# Patient Record
Sex: Female | Born: 1990 | Race: Black or African American | Hispanic: No | Marital: Single | Smoking: Former smoker
Health system: Southern US, Community
[De-identification: ages and names within clinical notes are randomized; demographics above are authoritative.]

## PROBLEM LIST (undated history)

## (undated) HISTORY — PX: APPENDECTOMY: SHX54

---

## 2017-11-18 ENCOUNTER — Encounter (HOSPITAL_BASED_OUTPATIENT_CLINIC_OR_DEPARTMENT_OTHER): Payer: Self-pay

## 2017-11-18 ENCOUNTER — Other Ambulatory Visit: Payer: Self-pay

## 2017-11-18 ENCOUNTER — Emergency Department (HOSPITAL_BASED_OUTPATIENT_CLINIC_OR_DEPARTMENT_OTHER)
Admission: EM | Admit: 2017-11-18 | Discharge: 2017-11-18 | Disposition: A | Discharge status: Home / Self Care | Payer: Self-pay | Attending: Emergency Medicine | Admitting: Emergency Medicine

## 2017-11-18 DIAGNOSIS — R197 Diarrhea, unspecified: Secondary | ICD-10-CM | POA: Insufficient documentation

## 2017-11-18 DIAGNOSIS — R05 Cough: Secondary | ICD-10-CM | POA: Insufficient documentation

## 2017-11-18 DIAGNOSIS — R51 Headache: Secondary | ICD-10-CM | POA: Insufficient documentation

## 2017-11-18 DIAGNOSIS — R0981 Nasal congestion: Secondary | ICD-10-CM | POA: Insufficient documentation

## 2017-11-18 DIAGNOSIS — R69 Illness, unspecified: Secondary | ICD-10-CM | POA: Insufficient documentation

## 2017-11-18 DIAGNOSIS — J029 Acute pharyngitis, unspecified: Secondary | ICD-10-CM | POA: Insufficient documentation

## 2017-11-18 DIAGNOSIS — R509 Fever, unspecified: Secondary | ICD-10-CM | POA: Insufficient documentation

## 2017-11-18 DIAGNOSIS — R112 Nausea with vomiting, unspecified: Secondary | ICD-10-CM | POA: Insufficient documentation

## 2017-11-18 DIAGNOSIS — J111 Influenza due to unidentified influenza virus with other respiratory manifestations: Secondary | ICD-10-CM

## 2017-11-18 DIAGNOSIS — F17210 Nicotine dependence, cigarettes, uncomplicated: Secondary | ICD-10-CM | POA: Insufficient documentation

## 2017-11-18 MED ORDER — SODIUM CHLORIDE 0.9 % IV BOLUS (SEPSIS)
1000.0000 mL | Freq: Once | INTRAVENOUS | Status: AC
  Administered 2017-11-18: 1000 mL via INTRAVENOUS

## 2017-11-18 MED ORDER — ONDANSETRON HCL 4 MG/2ML IJ SOLN
4.0000 mg | Freq: Once | INTRAMUSCULAR | Status: AC
  Administered 2017-11-18: 4 mg via INTRAVENOUS
  Filled 2017-11-18: qty 2

## 2017-11-18 MED ORDER — PROMETHAZINE HCL 25 MG PO TABS: 25.0000 mg | ORAL_TABLET | Freq: Four times a day (QID) | ORAL | 0 refills | Status: DC | PRN

## 2017-11-18 MED ORDER — HYDROCOD POLST-CPM POLST ER 10-8 MG/5ML PO SUER: 5.0000 mL | Freq: Two times a day (BID) | ORAL | 0 refills | Status: DC | PRN

## 2017-11-18 MED ORDER — KETOROLAC TROMETHAMINE 15 MG/ML IJ SOLN
15.0000 mg | Freq: Once | INTRAMUSCULAR | Status: AC
  Administered 2017-11-18: 15 mg via INTRAVENOUS
  Filled 2017-11-18: qty 1

## 2017-11-18 NOTE — ED Triage Notes (Signed)
Pt c/o body aches, n/v, runny nose and fever since Friday.  Pt states she has vomited 3 times today.  Pt took 1000mg  of tylenol at 2100 along with a dose of theraflu

## 2017-11-18 NOTE — ED Notes (Signed)
Pt was asleep, but states her pain is still the same.  Pt given ginger ale for PO challenge.

## 2017-11-18 NOTE — ED Notes (Signed)
Pt verbalizes understanding of d/c instructions and denies any further needs at this time. 

## 2017-11-18 NOTE — ED Provider Notes (Signed)
   [facility] Provider Note: Lowella DellJ. Lane Malick Netz, MD, FACEP  CSN: [id number] MRN: [id number] ARRIVAL: 11/18/17 at 0015 ROOM: [facility]   CHIEF COMPLAINT  Influenza   HISTORY OF PRESENT ILLNESS  11/18/17 12:27 AM Haley Baird is a 26 y.o. female a 4-day history of flulike symptoms.  Specifically she has had nasal congestion, sore throat, cough, fever to 103 degrees, body aches, chills, general malaise and headache behind her eyes.  She is also had watery diarrhea and yesterday had nausea and 3 episodes of vomiting.  She has been coughing and feels like she is having difficulty breathing but has not been wheezing.  She has taken 1 dose of TheraFlu and ibuprofen yesterday with improvement in her fever.  She rates her pain as a 7 out of 10.   History reviewed. No pertinent past medical history.  History reviewed. No pertinent surgical history.  No family history on file.  Social History   Tobacco Use  . Smoking status: Current Every Day Smoker    Packs/day: 0.50    Types: Cigarettes  . Smokeless tobacco: Never Used  Substance Use Topics  . Alcohol use: No    Frequency: Never  . Drug use: Not on file    Prior to Admission medications   Not on File    Allergies Patient has no known allergies.   REVIEW OF SYSTEMS  Negative except as noted here or in the History of Present Illness.   PHYSICAL EXAMINATION  Initial Vital Signs Blood pressure 121/79, pulse 93, temperature 99.9 F (37.7 C), temperature source Oral, resp. rate 18, height 5' (1.524 m), weight 63.5 kg (140 lb), last menstrual period 11/11/2017, SpO2 99 %.  Examination General: Well-developed, well-nourished female in no acute distress; appearance consistent with age of record HENT: normocephalic; atraumatic; mild pharyngeal erythema without exudate Eyes: pupils equal, round and reactive to light; extraocular muscles intact Neck: supple Heart: regular rate and rhythm Lungs: clear to auscultation  bilaterally; dry cough Abdomen: soft; nondistended; nontender; no masses or hepatosplenomegaly; bowel sounds present Extremities: No deformity; full range of motion; pulses normal Neurologic: Awake, alert and oriented; motor function intact in all extremities and symmetric; no facial droop Skin: Warm and dry Psychiatric: Flat affect   RESULTS  Summary of this visit's results, reviewed by myself:   EKG Interpretation  Date/Time:    Ventricular Rate:    PR Interval:    QRS Duration:   QT Interval:    QTC Calculation:   R Axis:     Text Interpretation:        Laboratory Studies: No results found for this or any previous visit (from the past 24 hour(s)). Imaging Studies: No results found.  ED COURSE  Nursing notes and initial vitals signs, including pulse oximetry, reviewed.  Vitals:   11/18/17 0021 11/18/17 0022  BP: 121/79   Pulse: 93   Resp: 18   Temp: 99.9 F (37.7 C)   TempSrc: Oral   SpO2: 99%   Weight:  63.5 kg (140 lb)  Height:  5' (1.524 m)   2:02 AM Drinking fluids without emesis after IV fluid bolus and Zofran.  PROCEDURES    ED DIAGNOSES     ICD-10-CM   1. Influenza-like illness R69        Paula LibraMolpus, Ninnie Fein, MD 11/18/17 [phone]

## 2017-11-18 NOTE — ED Notes (Signed)
ED Provider at bedside. 

## 2017-12-31 ENCOUNTER — Emergency Department (HOSPITAL_BASED_OUTPATIENT_CLINIC_OR_DEPARTMENT_OTHER)
Admission: EM | Admit: 2017-12-31 | Discharge: 2017-12-31 | Disposition: A | Discharge status: Home / Self Care | Payer: Self-pay | Attending: Emergency Medicine | Admitting: Emergency Medicine

## 2017-12-31 ENCOUNTER — Other Ambulatory Visit: Payer: Self-pay

## 2017-12-31 ENCOUNTER — Encounter (HOSPITAL_BASED_OUTPATIENT_CLINIC_OR_DEPARTMENT_OTHER): Payer: Self-pay | Admitting: Emergency Medicine

## 2017-12-31 DIAGNOSIS — Y929 Unspecified place or not applicable: Secondary | ICD-10-CM | POA: Insufficient documentation

## 2017-12-31 DIAGNOSIS — Y999 Unspecified external cause status: Secondary | ICD-10-CM | POA: Insufficient documentation

## 2017-12-31 DIAGNOSIS — Y939 Activity, unspecified: Secondary | ICD-10-CM | POA: Insufficient documentation

## 2017-12-31 DIAGNOSIS — S7012XA Contusion of left thigh, initial encounter: Secondary | ICD-10-CM | POA: Insufficient documentation

## 2017-12-31 DIAGNOSIS — F17210 Nicotine dependence, cigarettes, uncomplicated: Secondary | ICD-10-CM | POA: Insufficient documentation

## 2017-12-31 DIAGNOSIS — L02412 Cutaneous abscess of left axilla: Secondary | ICD-10-CM | POA: Insufficient documentation

## 2017-12-31 DIAGNOSIS — X58XXXA Exposure to other specified factors, initial encounter: Secondary | ICD-10-CM | POA: Insufficient documentation

## 2017-12-31 DIAGNOSIS — D509 Iron deficiency anemia, unspecified: Secondary | ICD-10-CM | POA: Insufficient documentation

## 2017-12-31 LAB — COMPREHENSIVE METABOLIC PANEL
ALBUMIN: 3.7 g/dL (ref 3.5–5.0)
ALT: 11 U/L — ABNORMAL LOW (ref 14–54)
ANION GAP: 7 (ref 5–15)
AST: 14 U/L — ABNORMAL LOW (ref 15–41)
Alkaline Phosphatase: 49 U/L (ref 38–126)
BUN: 9 mg/dL (ref 6–20)
CO2: 24 mmol/L (ref 22–32)
Calcium: 8.5 mg/dL — ABNORMAL LOW (ref 8.9–10.3)
Chloride: 107 mmol/L (ref 101–111)
Creatinine, Ser: 0.88 mg/dL (ref 0.44–1.00)
GFR calc Af Amer: >60 mL/min (ref >60)
GFR calc non Af Amer: >60 mL/min (ref >60)
GLUCOSE: 91 mg/dL (ref 65–99)
POTASSIUM: 3.9 mmol/L (ref 3.5–5.1)
SODIUM: 138 mmol/L (ref 135–145)
Total Bilirubin: 0.5 mg/dL (ref 0.3–1.2)
Total Protein: 6.4 g/dL — ABNORMAL LOW (ref 6.5–8.1)

## 2017-12-31 LAB — CBC
HCT: 32 % — ABNORMAL LOW (ref 36.0–46.0)
Hemoglobin: 10.9 g/dL — ABNORMAL LOW (ref 12.0–15.0)
MCH: 29.3 pg (ref 26.0–34.0)
MCHC: 34.1 g/dL (ref 30.0–36.0)
MCV: 86 fL (ref 78.0–100.0)
Platelets: 246 10*3/uL (ref 150–400)
RBC: 3.72 MIL/uL — ABNORMAL LOW (ref 3.87–5.11)
RDW: 14 % (ref 11.5–15.5)
WBC: 10.1 10*3/uL (ref 4.0–10.5)

## 2017-12-31 MED ORDER — LIDOCAINE-EPINEPHRINE 2 %-1:100000 IJ SOLN
20.0000 mL | Freq: Once | INTRAMUSCULAR | Status: AC
  Administered 2017-12-31: 20 mL via INTRADERMAL
  Filled 2017-12-31: qty 1

## 2017-12-31 NOTE — Discharge Instructions (Signed)
TAKE IRON DAILY AT NIGHT FOR YOUR ANEMIA  Contact a health care provider if: Your cyst or abscess returns. You have a fever. You have more redness, swelling, or pain around your incision. You have more fluid or blood coming from your incision. Your incision feels warm to the touch. You have pus or a bad smell coming from your incision. Get help right away if: You have severe pain or bleeding. You cannot eat or drink without vomiting. You have decreased urine output. You become short of breath. You have chest pain. You cough up blood. The area where the incision and drainage occurred becomes numb or it tingles.

## 2017-12-31 NOTE — ED Provider Notes (Signed)
[facility] EMERGENCY DEPARTMENT Provider Note   CSN: [id number] Arrival date & time: 12/31/17  4098     History   Chief Complaint Chief Complaint  Patient presents with  . Abscess  . Leg Pain    HPI Haley Baird is a 27 y.o. female Who presents for Lef axillary abscess. Onset 4 days ago with worsening pain, now 10/10. No fevers. No previous Hx. Patient also reports painful spots to her left leg.  Appears like bruises. No known injury . Patient is worried about blood clots but has no known hx. She is on OCPs and is a daily smoker.    HPI  History reviewed. No pertinent past medical history.  There are no active problems to display for this patient.   History reviewed. No pertinent surgical history.  OB History    No data available       Home Medications    Prior to Admission medications   Medication Sig Start Date End Date Taking? Authorizing Provider  chlorpheniramine-HYDROcodone (TUSSIONEX PENNKINETIC ER) 10-8 MG/5ML SUER Take 5 mLs by mouth every 12 (twelve) hours as needed. 11/18/17   Molpus, John, MD  promethazine (PHENERGAN) 25 MG tablet Take 1 tablet (25 mg total) by mouth every 6 (six) hours as needed for nausea or vomiting. 11/18/17   Molpus, Jonny Ruiz, MD    Family History History reviewed. No pertinent family history.  Social History Social History   Tobacco Use  . Smoking status: Current Every Day Smoker    Packs/day: 0.50    Types: Cigarettes  . Smokeless tobacco: Never Used  Substance Use Topics  . Alcohol use: No    Frequency: Never  . Drug use: Not on file     Allergies   Patient has no known allergies.   Review of Systems Review of Systems  Ten systems reviewed and are negative for acute change, except as noted in the HPI.   Physical Exam Updated Vital Signs BP 111/72   Pulse 88   Temp 98.6 F (37 C) (Oral)   Resp 18   Ht 5' (1.524 m)   Wt 63.5 kg (140 lb)   LMP 12/31/2017 (Exact Date)   SpO2 98%   BMI 27.34 kg/m    Physical Exam  Constitutional: She is oriented to person, place, and time. She appears well-developed and well-nourished. No distress.  HENT:  Head: Normocephalic and atraumatic.  Eyes: Conjunctivae are normal. No scleral icterus.  Neck: Normal range of motion.  Cardiovascular: Normal rate, regular rhythm and normal heart sounds. Exam reveals no gallop and no friction rub.  No murmur heard. Pulmonary/Chest: Effort normal and breath sounds normal. No respiratory distress.  Abdominal: Soft. Bowel sounds are normal. She exhibits no distension and no mass. There is no tenderness. There is no guarding.  Musculoskeletal:  2 cm fluctuant left axillary abscess with minimal surrounding induration, no signs of surrounding cellulitis  3 cm hematoma of the left lateral thigh  Neurological: She is alert and oriented to person, place, and time.  Skin: Skin is warm and dry. She is not diaphoretic.  Psychiatric: Her behavior is normal.  Nursing note and vitals reviewed.    ED Treatments / Results  Labs (all labs ordered are listed, but only abnormal results are displayed) Labs Reviewed  CBC - Abnormal; Notable for the following components:      Result Value   RBC 3.72 (*)    Hemoglobin 10.9 (*)    HCT 32.0 (*)  All other components within normal limits  COMPREHENSIVE METABOLIC PANEL - Abnormal; Notable for the following components:   Calcium 8.5 (*)    Total Protein 6.4 (*)    AST 14 (*)    ALT 11 (*)    All other components within normal limits    EKG  EKG Interpretation None       Radiology No results found.  Procedures Procedures (including critical care time)  Medications Ordered in ED Medications  lidocaine-EPINEPHrine (XYLOCAINE W/EPI) 2 %-1:100000 (with pres) injection 20 mL (20 mLs Intradermal Given 12/31/17 0945)   INCISION AND DRAINAGE Performed by: Arthor CaptainAbigail Barnaby Rippeon Consent: Verbal consent obtained. Risks and benefits: risks, benefits and alternatives were  discussed Type: abscess  Body area: Left axilla  Anesthesia: local infiltration  Incision was made with a scalpel.  Local anesthetic: lidocaine 2 % with epinephrine  Anesthetic total: 5 ml  Complexity: complex Blunt dissection to break up loculations  Drainage: purulent  Drainage amount: Moderate   Patient tolerance: Patient tolerated the procedure well with no immediate complications.     Initial Impression / Assessment and Plan / ED Course  I have reviewed the triage vital signs and the nursing notes.  Pertinent labs & imaging results that were available during my care of the patient were reviewed by me and considered in my medical decision making (see chart for details).    Patient with skin abscess amenable to incision and drainage.  Abscess was not large enough to warrant packing or drain,  wound recheck in 2 days. Encouraged home warm soaks and flushing.  Mild signs of cellulitis is surrounding skin.  Will d/c to home.  No antibiotic therapy is indicated.    Final Clinical Impressions(s) / ED Diagnoses   Final diagnoses:  Abscess of left axilla  Hematoma of left thigh, initial encounter  Iron deficiency anemia, unspecified iron deficiency anemia type    ED Discharge Orders    None       Arthor CaptainHarris, Aleya Durnell, PA-C 12/31/17 1836    Doug SouJacubowitz, Sam, MD 01/01/18 [phone]

## 2017-12-31 NOTE — ED Triage Notes (Signed)
Patient reports abscess to left axilla x 4 days.  No drainage noted.  Patient also reports left leg pain-states she has "3 spots" on leg that she causing the pain.  Denies injury.

## 2017-12-31 NOTE — ED Notes (Signed)
ED Provider at bedside for I&D of left axiliary area

## 2018-02-03 ENCOUNTER — Emergency Department (HOSPITAL_BASED_OUTPATIENT_CLINIC_OR_DEPARTMENT_OTHER)
Admission: EM | Admit: 2018-02-03 | Discharge: 2018-02-03 | Disposition: A | Discharge status: Home / Self Care | Payer: Self-pay | Attending: Emergency Medicine | Admitting: Emergency Medicine

## 2018-02-03 ENCOUNTER — Encounter (HOSPITAL_BASED_OUTPATIENT_CLINIC_OR_DEPARTMENT_OTHER): Payer: Self-pay | Admitting: *Deleted

## 2018-02-03 ENCOUNTER — Encounter (HOSPITAL_BASED_OUTPATIENT_CLINIC_OR_DEPARTMENT_OTHER): Payer: Self-pay | Admitting: Emergency Medicine

## 2018-02-03 ENCOUNTER — Other Ambulatory Visit: Payer: Self-pay

## 2018-02-03 DIAGNOSIS — Y939 Activity, unspecified: Secondary | ICD-10-CM | POA: Insufficient documentation

## 2018-02-03 DIAGNOSIS — F17210 Nicotine dependence, cigarettes, uncomplicated: Secondary | ICD-10-CM | POA: Insufficient documentation

## 2018-02-03 DIAGNOSIS — S50861A Insect bite (nonvenomous) of right forearm, initial encounter: Secondary | ICD-10-CM | POA: Insufficient documentation

## 2018-02-03 DIAGNOSIS — Z5189 Encounter for other specified aftercare: Secondary | ICD-10-CM

## 2018-02-03 DIAGNOSIS — Y929 Unspecified place or not applicable: Secondary | ICD-10-CM | POA: Insufficient documentation

## 2018-02-03 DIAGNOSIS — Y999 Unspecified external cause status: Secondary | ICD-10-CM | POA: Insufficient documentation

## 2018-02-03 DIAGNOSIS — W57XXXA Bitten or stung by nonvenomous insect and other nonvenomous arthropods, initial encounter: Secondary | ICD-10-CM | POA: Insufficient documentation

## 2018-02-03 DIAGNOSIS — Z4800 Encounter for change or removal of nonsurgical wound dressing: Secondary | ICD-10-CM | POA: Insufficient documentation

## 2018-02-03 MED ORDER — CEPHALEXIN 500 MG PO CAPS: 500.0000 mg | ORAL_CAPSULE | Freq: Four times a day (QID) | ORAL | 0 refills | Status: DC

## 2018-02-03 NOTE — ED Triage Notes (Signed)
She has a possible insect bite to her right forearm. She was here a few hours ago for same. She was given a Rx but never got the medication filled.

## 2018-02-03 NOTE — Discharge Instructions (Signed)
Apply warm compresses to area and take your antibiotics as directed by her provider yesterday.

## 2018-02-03 NOTE — ED Triage Notes (Signed)
Pt states she was bit by a spider yesterday at work on her [employer]. Today area is warm, raised, red, with intact skin that appears to have pus underneath. Denies fevers or other issue. Employer is aware she is here. She states they request a drug screen but states "I'm not doing it. I don't know why they need a drug screen for a spider bite".

## 2018-02-03 NOTE — ED Provider Notes (Signed)
[facility] EMERGENCY DEPARTMENT Provider Note   CSN: [id number] Arrival date & time: 02/03/18  0212     History   Chief Complaint Chief Complaint  Patient presents with  . Insect Bite    HPI Haley Baird is a 27 y.o. female.  Patient is a 27 year old female presenting with complaints of a spider bite.  She reports a spider bit her on the right forearm.  She has swelling, redness, and a small pustule in this area.  She denies any fevers or chills.   The history is provided by the patient.    History reviewed. No pertinent past medical history.  There are no active problems to display for this patient.   Past Surgical History:  Procedure Laterality Date  . APPENDECTOMY      OB History    No data available       Home Medications    Prior to Admission medications   Medication Sig Start Date End Date Taking? Authorizing Provider  chlorpheniramine-HYDROcodone (TUSSIONEX PENNKINETIC ER) 10-8 MG/5ML SUER Take 5 mLs by mouth every 12 (twelve) hours as needed. 11/18/17   Molpus, John, MD  promethazine (PHENERGAN) 25 MG tablet Take 1 tablet (25 mg total) by mouth every 6 (six) hours as needed for nausea or vomiting. 11/18/17   Molpus, John, MD    Family History No family history on file.  Social History Social History   Tobacco Use  . Smoking status: Current Every Day Smoker    Packs/day: 0.50    Types: Cigarettes  . Smokeless tobacco: Never Used  Substance Use Topics  . Alcohol use: No    Frequency: Never  . Drug use: Not on file     Allergies   Patient has no known allergies.   Review of Systems Review of Systems  All other systems reviewed and are negative.    Physical Exam Updated Vital Signs BP 133/65 (BP Location: Right Arm)   Pulse 77   Temp 98.5 F (36.9 C) (Oral)   Resp 20   Ht 5' (1.524 m)   Wt 63.5 kg (140 lb)   SpO2 100%   BMI 27.34 kg/m   Physical Exam  Constitutional: She appears well-developed and  well-nourished. No distress.  HENT:  Head: Normocephalic and atraumatic.  Neck: Normal range of motion. Neck supple.  Skin: Skin is warm and dry. She is not diaphoretic.  The right forearm has a small pustule to the volar aspect just distal to the elbow joint.  There is some surrounding erythema, however no significant fluctuance.  Nursing note and vitals reviewed.    ED Treatments / Results  Labs (all labs ordered are listed, but only abnormal results are displayed) Labs Reviewed - No data to display  EKG  EKG Interpretation None       Radiology No results found.  Procedures Procedures (including critical care time)  Medications Ordered in ED Medications - No data to display   Initial Impression / Assessment and Plan / ED Course  I have reviewed the triage vital signs and the nursing notes.  Pertinent labs & imaging results that were available during my care of the patient were reviewed by me and considered in my medical decision making (see chart for details).  I see no indication for I&D.  This will be treated with antibiotics and warm soaks.  To return as needed for any problems.  Final Clinical Impressions(s) / ED Diagnoses   Final diagnoses:  None  ED Discharge Orders    None       Geoffery Lyonselo, Berenize Gatlin, MD 02/03/18 [phone]

## 2018-02-03 NOTE — Discharge Instructions (Signed)
Keflex as prescribed.  Apply warm compresses as frequently as possible for the next several days.  Return to the emergency department for increased swelling, red streaks up and down the arm, worsening pain, or other new and concerning symptoms.

## 2018-02-03 NOTE — ED Provider Notes (Signed)
[facility] EMERGENCY DEPARTMENT Provider Note   CSN: [id number] Arrival date & time: 02/03/18  2158     History   Chief Complaint Chief Complaint  Patient presents with  . Wound Check    HPI Haley Baird is a 27 y.o. female who presents to ED for evaluation of a wound recheck.  She was seen and evaluated here approximately 20 hours ago for a bug bite to her right forearm.  She is here because she needs a note saying that she can return to work soon as possible.  She did not get her antibiotics filled but has been applying warm compresses with relief to the area.  Denies any fevers or additional symptoms at this time.  HPI  History reviewed. No pertinent past medical history.  There are no active problems to display for this patient.   Past Surgical History:  Procedure Laterality Date  . APPENDECTOMY      OB History    No data available       Home Medications    Prior to Admission medications   Medication Sig Start Date End Date Taking? Authorizing Provider  cephALEXin (KEFLEX) 500 MG capsule Take 1 capsule (500 mg total) by mouth 4 (four) times daily. 02/03/18   Geoffery Lyonselo, Douglas, MD  chlorpheniramine-HYDROcodone (TUSSIONEX PENNKINETIC ER) 10-8 MG/5ML SUER Take 5 mLs by mouth every 12 (twelve) hours as needed. 11/18/17   Molpus, John, MD  promethazine (PHENERGAN) 25 MG tablet Take 1 tablet (25 mg total) by mouth every 6 (six) hours as needed for nausea or vomiting. 11/18/17   Molpus, John, MD    Family History No family history on file.  Social History Social History   Tobacco Use  . Smoking status: Current Every Day Smoker    Packs/day: 0.50    Types: Cigarettes  . Smokeless tobacco: Never Used  Substance Use Topics  . Alcohol use: No    Frequency: Never  . Drug use: Not on file     Allergies   Patient has no known allergies.   Review of Systems Review of Systems  Constitutional: Negative for chills and fever.  Musculoskeletal: Negative  for arthralgias.  Skin: Positive for wound.     Physical Exam Updated Vital Signs BP 131/72   Pulse 83   Temp 99 F (37.2 C)   Resp 18   Ht 5' (1.524 m)   Wt 63.5 kg (140 lb)   LMP 01/21/2018   SpO2 99%   BMI 27.34 kg/m   Physical Exam  Constitutional: She appears well-developed and well-nourished. No distress.  Nontoxic appearing in no acute distress.  HENT:  Head: Normocephalic and atraumatic.  Eyes: Conjunctivae and EOM are normal. No scleral icterus.  Neck: Normal range of motion.  Pulmonary/Chest: Effort normal. No respiratory distress.  Neurological: She is alert.  Skin: No rash noted. She is not diaphoretic. There is erythema.  Small pustule noted on the right forearm just distal to the elbow.  Slight surrounding erythema but no fluctuance.  Psychiatric: She has a normal mood and affect.  Nursing note and vitals reviewed.    ED Treatments / Results  Labs (all labs ordered are listed, but only abnormal results are displayed) Labs Reviewed - No data to display  EKG  EKG Interpretation None       Radiology No results found.  Procedures Procedures (including critical care time)  Medications Ordered in ED Medications - No data to display   Initial Impression / Assessment and  Plan / ED Course  I have reviewed the triage vital signs and the nursing notes.  Pertinent labs & imaging results that were available during my care of the patient were reviewed by me and considered in my medical decision making (see chart for details).     Presents to ED for evaluation of wound recheck.  She was seen and evaluated here 20 hours ago for insect bite to her right forearm.  She did not get her antibiotics filled.  She is basically here because she needs a note saying that she can return to work tomorrow.  I did offer her 1 dose of her antibiotics here but she declined stating that "I promise I will get it filled tonight."  She is continuing to use warm compresses  with improvement in her symptoms.  The wound appears similar to the description from the note 20 hours ago.  Patient appears stable for discharge at this time.  Strict return precautions given.  Portions of this note were generated with Scientist, clinical (histocompatibility and immunogenetics). Dictation errors may occur despite best attempts at proofreading.   Final Clinical Impressions(s) / ED Diagnoses   Final diagnoses:  Visit for wound check    ED Discharge Orders    None       Dietrich Pates, PA-C 02/03/18 2334    Palumbo, April, MD 02/04/18 1610

## 2018-03-12 ENCOUNTER — Emergency Department (HOSPITAL_BASED_OUTPATIENT_CLINIC_OR_DEPARTMENT_OTHER): Payer: Self-pay

## 2018-03-12 ENCOUNTER — Emergency Department (HOSPITAL_BASED_OUTPATIENT_CLINIC_OR_DEPARTMENT_OTHER)
Admission: EM | Admit: 2018-03-12 | Discharge: 2018-03-12 | Disposition: A | Discharge status: Home / Self Care | Payer: Self-pay | Attending: Emergency Medicine | Admitting: Emergency Medicine

## 2018-03-12 ENCOUNTER — Other Ambulatory Visit: Payer: Self-pay

## 2018-03-12 ENCOUNTER — Encounter (HOSPITAL_BASED_OUTPATIENT_CLINIC_OR_DEPARTMENT_OTHER): Payer: Self-pay | Admitting: *Deleted

## 2018-03-12 DIAGNOSIS — Z79899 Other long term (current) drug therapy: Secondary | ICD-10-CM | POA: Insufficient documentation

## 2018-03-12 DIAGNOSIS — F17210 Nicotine dependence, cigarettes, uncomplicated: Secondary | ICD-10-CM | POA: Insufficient documentation

## 2018-03-12 DIAGNOSIS — R0789 Other chest pain: Secondary | ICD-10-CM | POA: Insufficient documentation

## 2018-03-12 LAB — CBC WITH DIFFERENTIAL/PLATELET
BASOS ABS: 0 10*3/uL (ref 0.0–0.1)
BASOS PCT: 0 %
Eosinophils Absolute: 0.1 10*3/uL (ref 0.0–0.7)
Eosinophils Relative: 1 %
HEMATOCRIT: 34.2 % — AB (ref 36.0–46.0)
HEMOGLOBIN: 11.6 g/dL — AB (ref 12.0–15.0)
Lymphocytes Relative: 31 %
Lymphs Abs: 3.1 10*3/uL (ref 0.7–4.0)
MCH: 29.1 pg (ref 26.0–34.0)
MCHC: 33.9 g/dL (ref 30.0–36.0)
MCV: 85.7 fL (ref 78.0–100.0)
Monocytes Absolute: 1.1 10*3/uL — ABNORMAL HIGH (ref 0.1–1.0)
Monocytes Relative: 11 %
NEUTROS ABS: 5.7 10*3/uL (ref 1.7–7.7)
NEUTROS PCT: 57 %
Platelets: 237 10*3/uL (ref 150–400)
RBC: 3.99 MIL/uL (ref 3.87–5.11)
RDW: 13.3 % (ref 11.5–15.5)
WBC: 10 10*3/uL (ref 4.0–10.5)

## 2018-03-12 LAB — TROPONIN I

## 2018-03-12 LAB — COMPREHENSIVE METABOLIC PANEL
ALBUMIN: 3.8 g/dL (ref 3.5–5.0)
ALK PHOS: 50 U/L (ref 38–126)
ALT: 13 U/L — AB (ref 14–54)
AST: 17 U/L (ref 15–41)
Anion gap: 8 (ref 5–15)
BILIRUBIN TOTAL: 0.3 mg/dL (ref 0.3–1.2)
BUN: 9 mg/dL (ref 6–20)
CO2: 23 mmol/L (ref 22–32)
Calcium: 8.8 mg/dL — ABNORMAL LOW (ref 8.9–10.3)
Chloride: 105 mmol/L (ref 101–111)
Creatinine, Ser: 0.85 mg/dL (ref 0.44–1.00)
GFR calc Af Amer: >60 mL/min (ref >60)
GFR calc non Af Amer: >60 mL/min (ref >60)
GLUCOSE: 97 mg/dL (ref 65–99)
POTASSIUM: 3.9 mmol/L (ref 3.5–5.1)
Sodium: 136 mmol/L (ref 135–145)
TOTAL PROTEIN: 7.1 g/dL (ref 6.5–8.1)

## 2018-03-12 LAB — LIPASE, BLOOD: Lipase: 32 U/L (ref 11–51)

## 2018-03-12 LAB — D-DIMER, QUANTITATIVE: D-Dimer, Quant: <0.27 ug/mL-FEU (ref 0.00–0.50)

## 2018-03-12 LAB — PREGNANCY, URINE: Preg Test, Ur: NEGATIVE

## 2018-03-12 MED ORDER — METHOCARBAMOL 750 MG PO TABS: 750.0000 mg | ORAL_TABLET | Freq: Three times a day (TID) | ORAL | 0 refills | Status: DC | PRN

## 2018-03-12 MED ORDER — GI COCKTAIL ~~LOC~~
30.0000 mL | Freq: Once | ORAL | Status: AC
  Administered 2018-03-12: 30 mL via ORAL
  Filled 2018-03-12: qty 30

## 2018-03-12 NOTE — Discharge Instructions (Addendum)
You are being prescribed a medication which may make you sleepy. For 24 hours after one dose please do not drive, operate heavy machinery, care for a small child with out another adult present, or perform any activities that may cause harm to you or someone else if you were to fall asleep or be impaired.   I have given you information on heart burn.  Please start taking Maalox liquid as directed on the bottle for heart burn.  In addition please start taking Prilosec for the next two weeks.  Please follow all directions on the containers.  Please obtain and follow up with your primary care doctor.  If your chest pain worsens, radiates up to her neck or down the left arm or you have any concerns please seek additional medical evaluation.

## 2018-03-12 NOTE — ED Triage Notes (Signed)
Sharp epigastric pain x 1 week. Cough x 2 days. Pain gets worse when she eats or raises her right arm.

## 2018-03-12 NOTE — ED Notes (Signed)
ED Provider at bedside. 

## 2018-03-12 NOTE — ED Provider Notes (Signed)
[facility] EMERGENCY DEPARTMENT Provider Note   CSN: [id number] Arrival date & time: 03/12/18  1516     History   Chief Complaint Chief Complaint  Patient presents with  . Chest Pain    HPI Haley Baird is a 27 y.o. female who presents today for evaluation of chest pain.  She reports that her chest pain has been going on for about 1 week and reports that it feels like shooting through from her front to her back.  She denies any fevers at home, she reports that she is getting over nasal congestion, however denies coughs.  She  Reports feeling generally short of breath.  She reports ehr pain is worse when she eats.  She does not have any gall bladder history.  She denies any recent trauma.   HPI  History reviewed. No pertinent past medical history.  There are no active problems to display for this patient.   Past Surgical History:  Procedure Laterality Date  . APPENDECTOMY      OB History   None      Home Medications    Prior to Admission medications   Medication Sig Start Date End Date Taking? Authorizing Provider  cephALEXin (KEFLEX) 500 MG capsule Take 1 capsule (500 mg total) by mouth 4 (four) times daily. 02/03/18   Geoffery Lyonselo, Douglas, MD  chlorpheniramine-HYDROcodone (TUSSIONEX PENNKINETIC ER) 10-8 MG/5ML SUER Take 5 mLs by mouth every 12 (twelve) hours as needed. 11/18/17   Molpus, John, MD  methocarbamol (ROBAXIN) 750 MG tablet Take 1-2 tablets (750-1,500 mg total) by mouth 3 (three) times daily as needed for muscle spasms. 03/12/18   Cristina GongHammond, Shaan Rhoads W, PA-C  promethazine (PHENERGAN) 25 MG tablet Take 1 tablet (25 mg total) by mouth every 6 (six) hours as needed for nausea or vomiting. 11/18/17   Molpus, John, MD    Family History No family history on file.  Social History Social History   Tobacco Use  . Smoking status: Current Every Day Smoker    Packs/day: 0.50    Types: Cigarettes  . Smokeless tobacco: Never Used  Substance Use Topics  .  Alcohol use: No    Frequency: Never  . Drug use: Not on file     Allergies   Patient has no known allergies.   Review of Systems Review of Systems  Constitutional: Negative for activity change and fever.  Respiratory: Positive for chest tightness and shortness of breath.   Cardiovascular: Positive for chest pain. Negative for palpitations and leg swelling.  Gastrointestinal: Positive for nausea. Negative for abdominal pain ("my belly is fine."), constipation, diarrhea and vomiting.  Genitourinary: Negative for dysuria and hematuria.  Musculoskeletal: Positive for back pain. Negative for neck pain.  Skin: Negative for rash.  Neurological: Negative for headaches.  All other systems reviewed and are negative.    Physical Exam Updated Vital Signs BP 108/63 (BP Location: Left Arm)   Pulse 82   Temp 98 F (36.7 C) (Oral)   Resp 20   Ht 5' (1.524 m)   Wt 65.8 kg (145 lb)   LMP 03/05/2018   SpO2 100%   BMI 28.32 kg/m   Physical Exam  Constitutional: She appears well-developed and well-nourished.  Non-toxic appearance. No distress.  HENT:  Head: Normocephalic and atraumatic.  Eyes: Conjunctivae are normal.  Neck: Normal range of motion. Neck supple.  Cardiovascular: Normal rate and regular rhythm.  No murmur heard. Pulses:      Radial pulses are 2+ on the right  side, and 2+ on the left side.  Pulmonary/Chest: Effort normal and breath sounds normal. No respiratory distress. She has no decreased breath sounds. She has no wheezes. She has no rhonchi. She has no rales.  Patient is able to speak for multiple sentences before taking a breath.    Abdominal: Soft. Bowel sounds are normal. She exhibits no distension. There is no tenderness.  Musculoskeletal: She exhibits no edema.       Right lower leg: Normal. She exhibits no tenderness and no edema.       Left lower leg: Normal. She exhibits no tenderness and no edema.  Palpation medially to left scapula recreates and worsens  her pain.    Neurological: She is alert.  Skin: Skin is warm and dry.  Psychiatric: She has a normal mood and affect. Her behavior is normal.  Nursing note and vitals reviewed.    ED Treatments / Results  Labs (all labs ordered are listed, but only abnormal results are displayed) Labs Reviewed  CBC WITH DIFFERENTIAL/PLATELET - Abnormal; Notable for the following components:      Result Value   Hemoglobin 11.6 (*)    HCT 34.2 (*)    Monocytes Absolute 1.1 (*)    All other components within normal limits  COMPREHENSIVE METABOLIC PANEL - Abnormal; Notable for the following components:   Calcium 8.8 (*)    ALT 13 (*)    All other components within normal limits  LIPASE, BLOOD  PREGNANCY, URINE  D-DIMER, QUANTITATIVE (NOT AT [facility])  TROPONIN I    EKG  EKG Interpretation  Date/Time:  Thursday March 12 2018 15:21:25 EDT Ventricular Rate:  72 PR Interval:  172 QRS Duration: 98 QT Interval:  362 QTC Calculation: 396 R Axis:   90 Text Interpretation:  Normal sinus rhythm Rightward axis Borderline ECG No prior ECG for comparison NO STEMI Confirmed by Theda Belfast ([id number]) on 03/12/2018 3:26:51 PM       Radiology Dg Chest 2 View  Result Date: 03/12/2018 CLINICAL DATA:  Mid to left-sided chest pain due radiating to the left scapula with dyspnea x3 days. EXAM: CHEST - 2 VIEW COMPARISON:  None. FINDINGS: The heart size and mediastinal contours are within normal limits. Both lungs are clear. The visualized skeletal structures are unremarkable. IMPRESSION: No active cardiopulmonary disease. Electronically Signed   By: Tollie Eth M.D.   On: 03/12/2018 18:21    Procedures Procedures (including critical care time)  Medications Ordered in ED Medications  gi cocktail (Maalox,Lidocaine,Donnatal) (30 mLs Oral Given 03/12/18 1843)     Initial Impression / Assessment and Plan / ED Course  I have reviewed the triage vital signs and the nursing notes.  Pertinent labs & imaging results  that were available during my care of the patient were reviewed by me and considered in my medical decision making (see chart for details).  Clinical Course as of Mar 12 2141  Thu Mar 12, 2018  1840 Patient reevaluated, informed of her results.  She is refusing the p.o. challenge and just wishes for discharge.  Return precautions were discussed and she states understanding.   [EH]    Clinical Course User Index [EH] Cristina Gong, PA-C    Patient is to be discharged with recommendation to follow up with PCP in regards to today's hospital visit. Chest pain is not likely of cardiac or pulmonary etiology d/t presentation, d-dimer negative, VSS, no tracheal deviation, no JVD or new murmur, RRR, breath sounds equal bilaterally, EKG without acute  abnormalities, negative troponin, and negative CXR. Pt has been advised to return to the ED if CP becomes exertional, associated with diaphoresis or nausea, radiates to left jaw/arm, worsens or becomes concerning in any way. Pt appears reliable for follow up and is agreeable to discharge.   Suspect CP is caused by a combination of muscle spasms and heart burn.  Treatments for both of these conditions discussed.     Final Clinical Impressions(s) / ED Diagnoses   Final diagnoses:  Atypical chest pain    ED Discharge Orders        Ordered    methocarbamol (ROBAXIN) 750 MG tablet  3 times daily PRN     03/12/18 1844       Cristina Gong, PA-C 03/12/18 2142    Tegeler, Canary Brim, MD 03/13/18 [id number]

## 2018-03-12 NOTE — ED Notes (Signed)
Patient transported to X-ray 

## 2018-03-12 NOTE — ED Notes (Signed)
Pt unhappy with her wait time. Pt given apology and explained triage process.

## 2018-03-12 NOTE — ED Notes (Signed)
Pt on monitor 

## 2018-06-21 ENCOUNTER — Other Ambulatory Visit: Payer: Self-pay

## 2018-06-21 ENCOUNTER — Encounter (HOSPITAL_BASED_OUTPATIENT_CLINIC_OR_DEPARTMENT_OTHER): Payer: Self-pay | Admitting: *Deleted

## 2018-06-21 ENCOUNTER — Emergency Department (HOSPITAL_BASED_OUTPATIENT_CLINIC_OR_DEPARTMENT_OTHER)
Admission: EM | Admit: 2018-06-21 | Discharge: 2018-06-21 | Disposition: A | Discharge status: Home / Self Care | Payer: Self-pay | Attending: Emergency Medicine | Admitting: Emergency Medicine

## 2018-06-21 DIAGNOSIS — Z79899 Other long term (current) drug therapy: Secondary | ICD-10-CM | POA: Insufficient documentation

## 2018-06-21 DIAGNOSIS — F17210 Nicotine dependence, cigarettes, uncomplicated: Secondary | ICD-10-CM | POA: Insufficient documentation

## 2018-06-21 DIAGNOSIS — R51 Headache: Secondary | ICD-10-CM | POA: Insufficient documentation

## 2018-06-21 DIAGNOSIS — R519 Headache, unspecified: Secondary | ICD-10-CM

## 2018-06-21 MED ORDER — ACETAMINOPHEN 325 MG PO TABS
650.0000 mg | ORAL_TABLET | Freq: Once | ORAL | Status: AC
  Administered 2018-06-21: 650 mg via ORAL
  Filled 2018-06-21: qty 2

## 2018-06-21 MED ORDER — NAPROXEN 250 MG PO TABS
500.0000 mg | ORAL_TABLET | Freq: Once | ORAL | Status: AC
  Administered 2018-06-21: 500 mg via ORAL
  Filled 2018-06-21: qty 2

## 2018-06-21 MED ORDER — IBUPROFEN 600 MG PO TABS: 600.0000 mg | ORAL_TABLET | Freq: Four times a day (QID) | ORAL | 0 refills | Status: DC | PRN

## 2018-06-21 MED ORDER — ACETAMINOPHEN ER 650 MG PO TBCR: 650.0000 mg | EXTENDED_RELEASE_TABLET | Freq: Three times a day (TID) | ORAL | 0 refills | Status: DC

## 2018-06-21 NOTE — ED Provider Notes (Signed)
[facility] EMERGENCY DEPARTMENT Provider Note   CSN: [id number] Arrival date & time: 06/21/18  0234     History   Chief Complaint Chief Complaint  Patient presents with  . Headache    HPI Haley Baird is a 27 y.o. female.  HPI 27 year old female comes in with chief complaint of headaches. Patient states that she has had moderately severe headaches that present intermittently for the last several months.  The current episode has been going on for 2 days and is also intermittent, but lasting longer than usual.  Patient states that the headaches are at different spots and described as throbbing and sharp pain.  Patient has light and sound sensitivity and she also feels like there is pain behind both of her eyes.  Patient denies any vision change and she has no nausea, numbness, tingling, dizziness.  Family history of migraines is present.  There is no pattern to the headache, particularly it is not related to her periods or time of the day or positions.  History reviewed. No pertinent past medical history.  There are no active problems to display for this patient.   Past Surgical History:  Procedure Laterality Date  . APPENDECTOMY       OB History   None      Home Medications    Prior to Admission medications   Medication Sig Start Date End Date Taking? Authorizing Provider  acetaminophen (TYLENOL 8 HOUR) 650 MG CR tablet Take 1 tablet (650 mg total) by mouth every 8 (eight) hours. 06/21/18   Derwood Kaplan, MD  cephALEXin (KEFLEX) 500 MG capsule Take 1 capsule (500 mg total) by mouth 4 (four) times daily. 02/03/18   Geoffery Lyons, MD  chlorpheniramine-HYDROcodone (TUSSIONEX PENNKINETIC ER) 10-8 MG/5ML SUER Take 5 mLs by mouth every 12 (twelve) hours as needed. 11/18/17   Molpus, John, MD  ibuprofen (ADVIL,MOTRIN) 600 MG tablet Take 1 tablet (600 mg total) by mouth every 6 (six) hours as needed. 06/21/18   Derwood Kaplan, MD  methocarbamol (ROBAXIN) 750 MG tablet  Take 1-2 tablets (750-1,500 mg total) by mouth 3 (three) times daily as needed for muscle spasms. 03/12/18   Cristina Gong, PA-C  promethazine (PHENERGAN) 25 MG tablet Take 1 tablet (25 mg total) by mouth every 6 (six) hours as needed for nausea or vomiting. 11/18/17   Molpus, Jonny Ruiz, MD    Family History History reviewed. No pertinent family history.  Social History Social History   Tobacco Use  . Smoking status: Current Every Day Smoker    Packs/day: 0.50    Types: Cigarettes  . Smokeless tobacco: Never Used  Substance Use Topics  . Alcohol use: No    Frequency: Never  . Drug use: Not on file     Allergies   Patient has no known allergies.   Review of Systems Review of Systems  Constitutional: Positive for activity change.  Eyes: Negative for visual disturbance.  Gastrointestinal: Negative for nausea and vomiting.  Allergic/Immunologic: Negative for immunocompromised state.  Neurological: Positive for headaches. Negative for dizziness, seizures, syncope, speech difficulty, weakness, light-headedness and numbness.  Hematological: Does not bruise/bleed easily.     Physical Exam Updated Vital Signs BP (!) 107/57 (BP Location: Left Arm)   Pulse (!) 52   Temp 98.2 F (36.8 C) (Oral)   Resp 18   Ht 5\' 3"  (1.6 m)   Wt 67.1 kg (148 lb)   LMP 06/20/2018 (Exact Date)   SpO2 100%   BMI 26.22 kg/m  Physical Exam  Constitutional: She is oriented to person, place, and time. She appears well-developed.  HENT:  Head: Normocephalic and atraumatic.  Eyes: Pupils are equal, round, and reactive to light. EOM are normal.  Neck: Normal range of motion. Neck supple.  Cardiovascular: Normal rate.  Pulmonary/Chest: Effort normal.  Abdominal: Bowel sounds are normal.  Neurological: She is alert and oriented to person, place, and time. She is not disoriented. No cranial nerve deficit or sensory deficit.  Skin: Skin is warm and dry.  Nursing note and vitals  reviewed.    ED Treatments / Results  Labs (all labs ordered are listed, but only abnormal results are displayed) Labs Reviewed - No data to display  EKG None  Radiology No results found.  Procedures Procedures (including critical care time)  Medications Ordered in ED Medications  naproxen (NAPROSYN) tablet 500 mg (has no administration in time range)  acetaminophen (TYLENOL) tablet 650 mg (has no administration in time range)     Initial Impression / Assessment and Plan / ED Course  I have reviewed the triage vital signs and the nursing notes.  Pertinent labs & imaging results that were available during my care of the patient were reviewed by me and considered in my medical decision making (see chart for details).     27 year old female comes in with chief complaint of intermittent headaches.  Headaches have been present for the last several months.  There is no specific trigger or evoking factors.  Based on history, without any space-occupying lesion to the brain or brain aneurysms.  There is no focal neurologic complaints.  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  We will treat her with Tylenol and ibuprofen, we have given  Her [facility] follow-up.  Final Clinical Impressions(s) / ED Diagnoses   Final diagnoses:  Bad headache  Intermittent headache    ED Discharge Orders        Ordered    ibuprofen (ADVIL,MOTRIN) 600 MG tablet  Every 6 hours PRN     06/21/18 0526    acetaminophen (TYLENOL 8 HOUR) 650 MG CR tablet  Every 8 hours     06/21/18 0526       Derwood KaplanNanavati, Brianne Maina, MD 06/21/18 0530

## 2018-06-21 NOTE — ED Notes (Signed)
Pt given d/c instructions as per chart. Rx x 2. Verbalizes understanding. No questions. 

## 2018-06-21 NOTE — ED Notes (Signed)
ED Provider at bedside. 

## 2018-06-21 NOTE — Discharge Instructions (Signed)
We saw you in the ER for headaches.  We are not sure what is causing your symptoms. The workup in the ER is not complete, and is limited to screening for life threatening and emergent conditions only, so please see a primary care doctor for further evaluation.  Please return to the ER if the headache gets severe and in not improving, you have associated new one sided numbness, tingling, weakness or confusion, seizures, poor balance or poor vision.

## 2018-06-21 NOTE — ED Triage Notes (Signed)
Headache x 2 days. Pt states she gets frequent H/As, but has not been diagnosed with migraines. Sensitive to light and noise. PERRL.

## 2018-06-21 NOTE — ED Notes (Signed)
Pt triaged by this RN. Took Tylenol at 9p and Excedrin at 0100. Resting quietly. Eyes closed. Easily aroused.

## 2019-07-08 IMAGING — DX DG CHEST 2V
2 series · 2 of 2 positions shown · non-contrast
Comparison: None.

CLINICAL DATA: Mid to left-sided chest pain due radiating to the
left scapula with dyspnea x3 days.

EXAM:
CHEST - 2 VIEW

[chest pa]
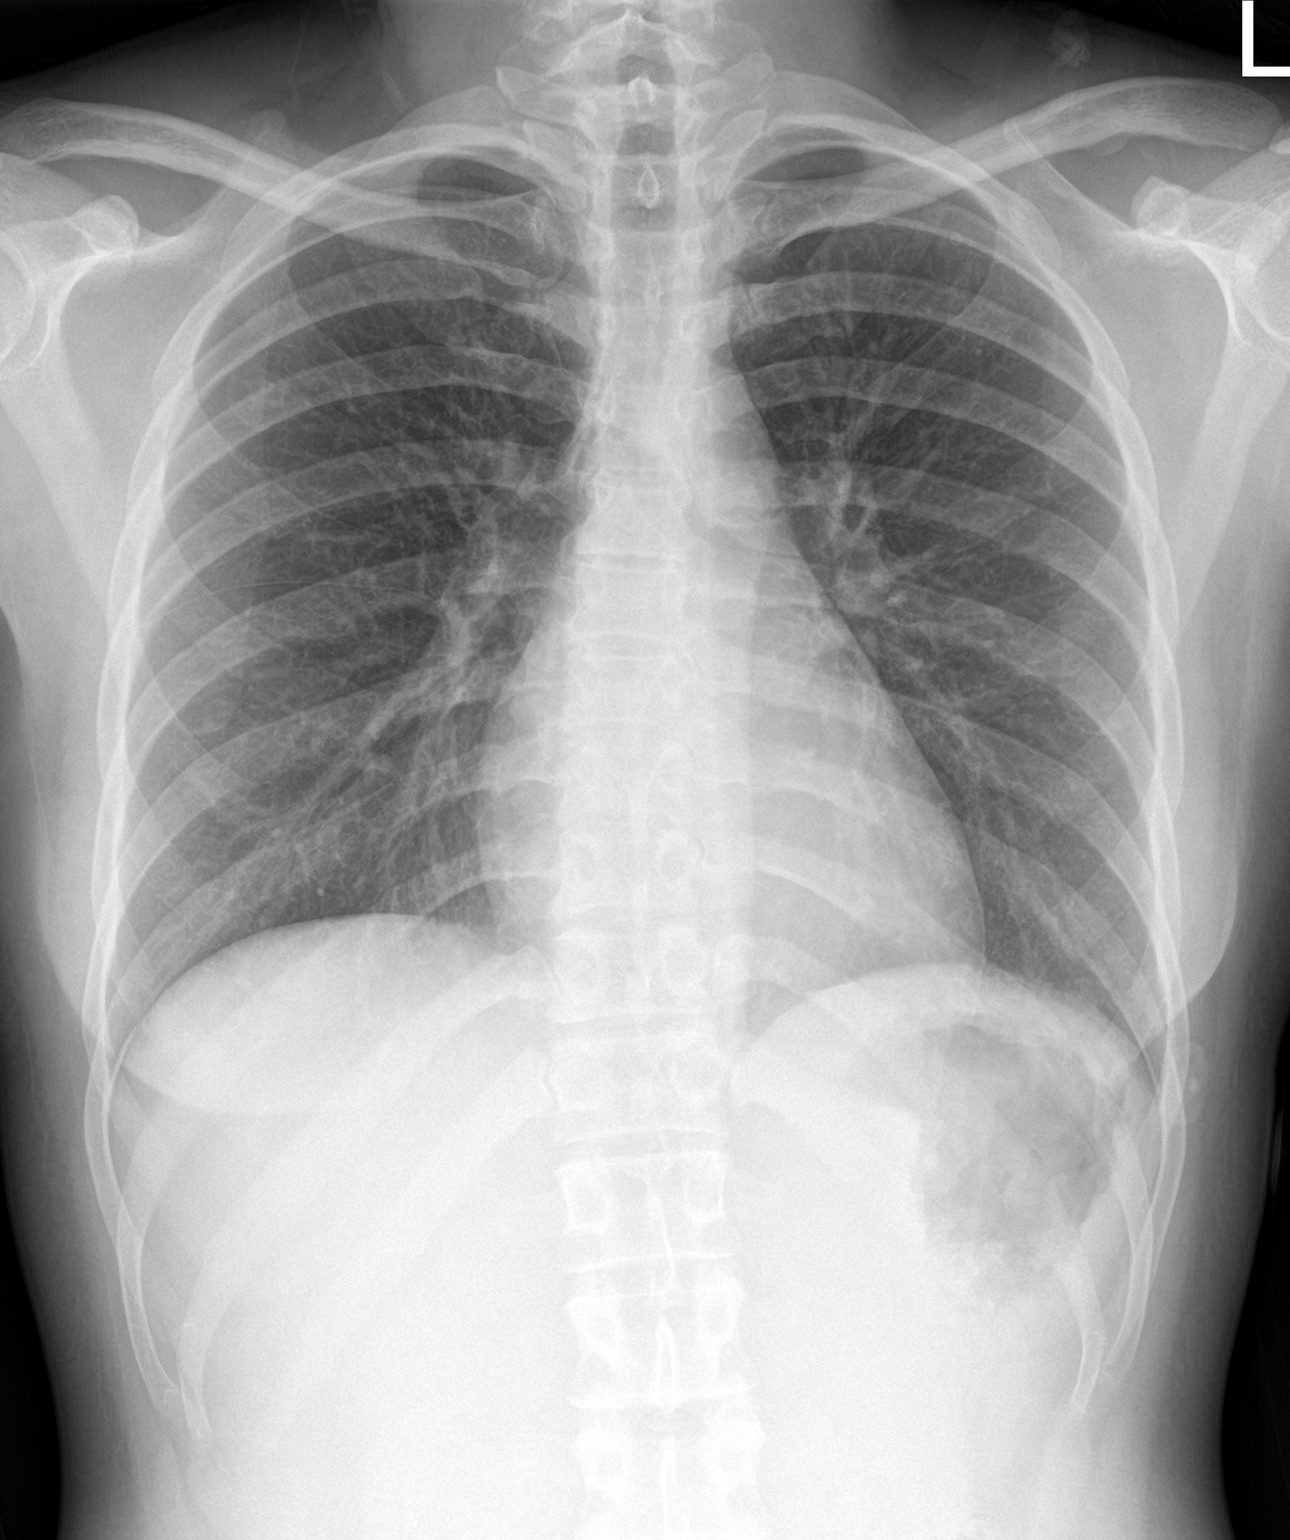

[chest lat]
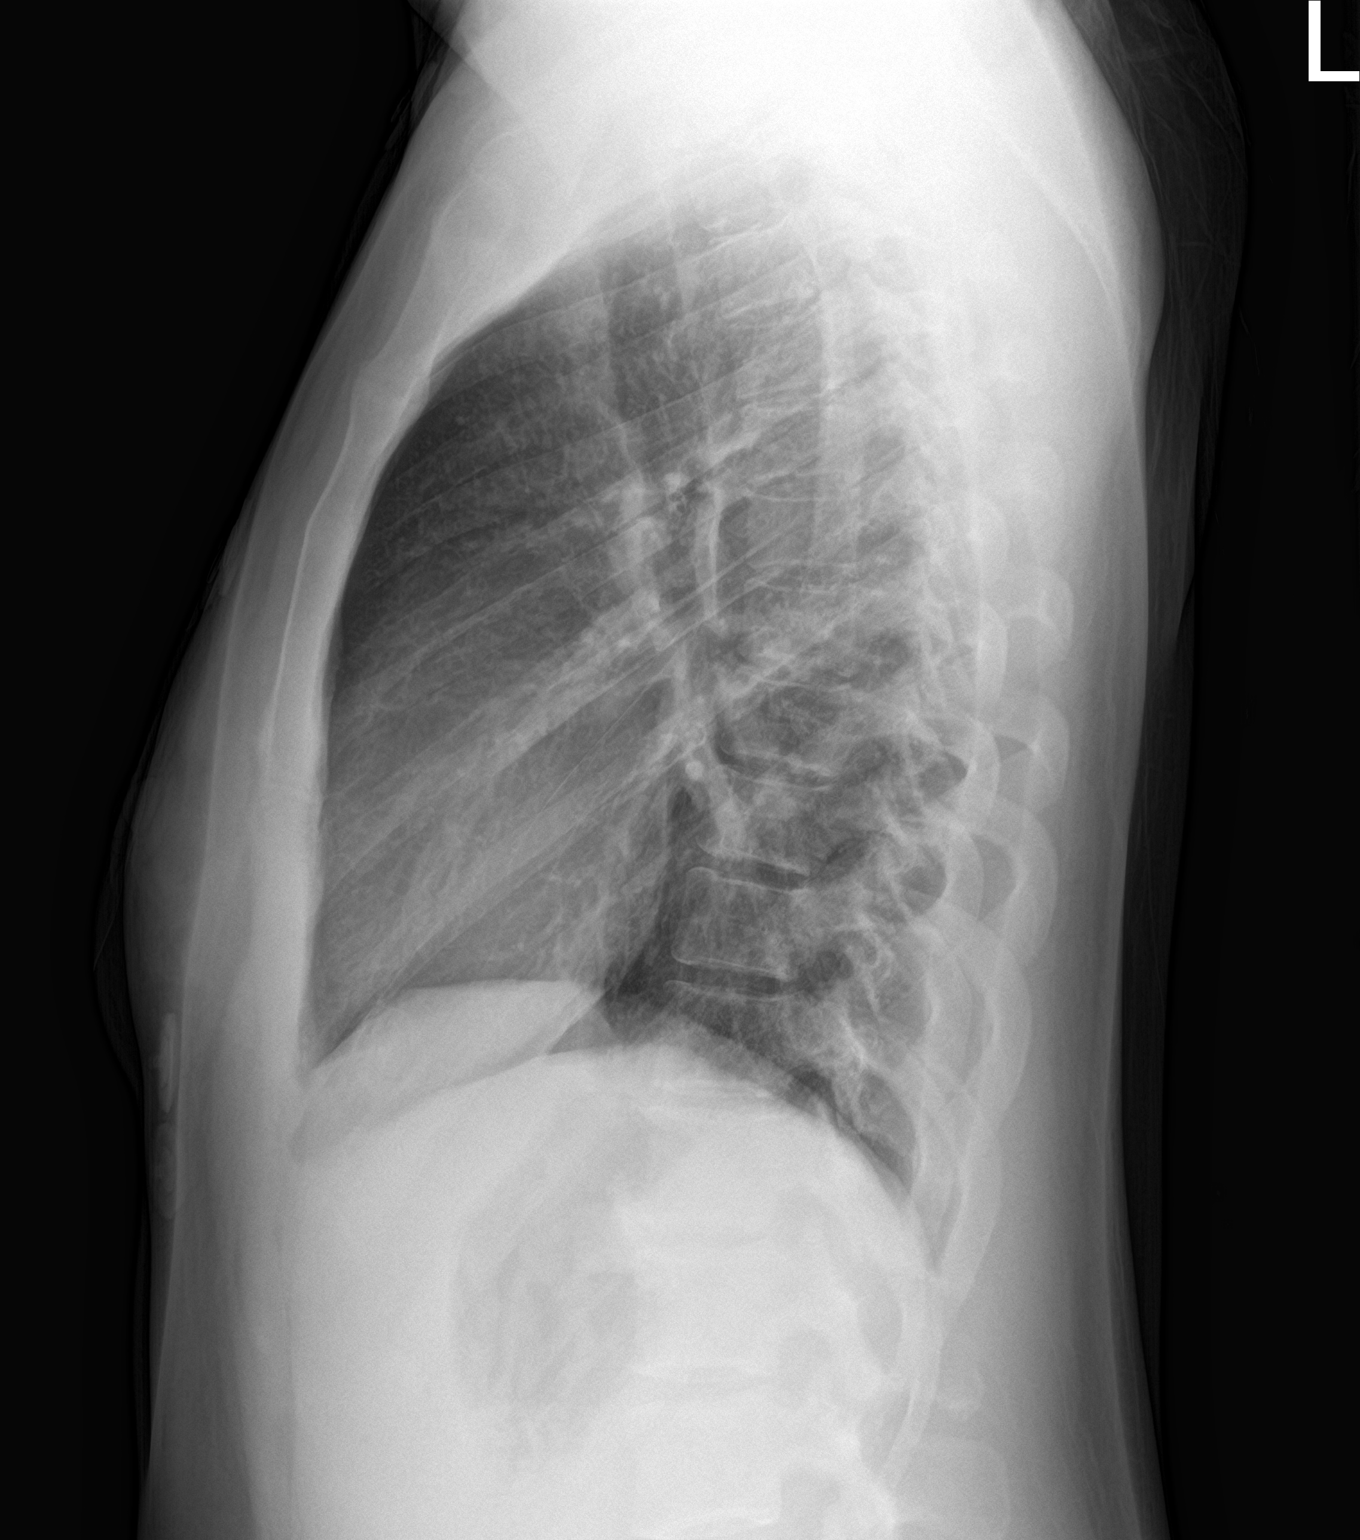

[2 of 2 positions shown; findings below may reference images not displayed]

FINDINGS: The heart size and mediastinal contours are within normal limits.
Both lungs are clear. The visualized skeletal structures are
unremarkable.
IMPRESSION: No active cardiopulmonary disease.

## 2020-11-28 ENCOUNTER — Emergency Department (HOSPITAL_BASED_OUTPATIENT_CLINIC_OR_DEPARTMENT_OTHER)
Admission: EM | Admit: 2020-11-28 | Discharge: 2020-11-28 | Disposition: A | Discharge status: Home / Self Care | Payer: Managed Care, Other (non HMO) | Attending: Emergency Medicine | Admitting: Emergency Medicine

## 2020-11-28 ENCOUNTER — Other Ambulatory Visit: Payer: Self-pay

## 2020-11-28 ENCOUNTER — Encounter (HOSPITAL_BASED_OUTPATIENT_CLINIC_OR_DEPARTMENT_OTHER): Payer: Self-pay | Admitting: *Deleted

## 2020-11-28 DIAGNOSIS — Z5321 Procedure and treatment not carried out due to patient leaving prior to being seen by health care provider: Secondary | ICD-10-CM | POA: Insufficient documentation

## 2020-11-28 DIAGNOSIS — R109 Unspecified abdominal pain: Secondary | ICD-10-CM | POA: Diagnosis present

## 2020-11-28 NOTE — ED Triage Notes (Signed)
Abdominal discomfort since IUD placement and still bleeding and cramping.

## 2021-04-23 ENCOUNTER — Encounter (HOSPITAL_BASED_OUTPATIENT_CLINIC_OR_DEPARTMENT_OTHER): Payer: Self-pay | Admitting: *Deleted

## 2021-04-23 ENCOUNTER — Other Ambulatory Visit: Payer: Self-pay

## 2021-04-23 ENCOUNTER — Emergency Department (HOSPITAL_BASED_OUTPATIENT_CLINIC_OR_DEPARTMENT_OTHER)
Admission: EM | Admit: 2021-04-23 | Discharge: 2021-04-23 | Disposition: A | Discharge status: Home / Self Care | Payer: PRIVATE HEALTH INSURANCE | Attending: Emergency Medicine | Admitting: Emergency Medicine

## 2021-04-23 DIAGNOSIS — Z28310 Unvaccinated for covid-19: Secondary | ICD-10-CM | POA: Insufficient documentation

## 2021-04-23 DIAGNOSIS — F17210 Nicotine dependence, cigarettes, uncomplicated: Secondary | ICD-10-CM | POA: Diagnosis not present

## 2021-04-23 DIAGNOSIS — M791 Myalgia, unspecified site: Secondary | ICD-10-CM | POA: Diagnosis present

## 2021-04-23 DIAGNOSIS — U071 COVID-19: Secondary | ICD-10-CM | POA: Insufficient documentation

## 2021-04-23 DIAGNOSIS — R112 Nausea with vomiting, unspecified: Secondary | ICD-10-CM

## 2021-04-23 LAB — RESP PANEL BY RT-PCR (FLU A&B, COVID) ARPGX2
Influenza A by PCR: NEGATIVE
Influenza B by PCR: NEGATIVE
SARS Coronavirus 2 by RT PCR: POSITIVE — AB

## 2021-04-23 MED ORDER — KETOROLAC TROMETHAMINE 30 MG/ML IJ SOLN
30.0000 mg | Freq: Once | INTRAMUSCULAR | Status: AC
  Administered 2021-04-23: 30 mg via INTRAMUSCULAR
  Filled 2021-04-23: qty 1

## 2021-04-23 MED ORDER — ONDANSETRON 4 MG PO TBDP: 4.0000 mg | ORAL_TABLET | Freq: Three times a day (TID) | ORAL | 0 refills | Status: AC | PRN

## 2021-04-23 MED ORDER — ACETAMINOPHEN 500 MG PO TABS
1000.0000 mg | ORAL_TABLET | Freq: Once | ORAL | Status: AC
  Administered 2021-04-23: 1000 mg via ORAL
  Filled 2021-04-23: qty 2

## 2021-04-23 MED ORDER — KETOROLAC TROMETHAMINE 30 MG/ML IJ SOLN
30.0000 mg | Freq: Once | INTRAMUSCULAR | Status: DC
Start: 2021-04-23 — End: 2021-04-23

## 2021-04-23 MED ORDER — SODIUM CHLORIDE 0.9 % IV BOLUS: 500.0000 mL | Freq: Once | INTRAVENOUS | Status: DC

## 2021-04-23 NOTE — ED Triage Notes (Signed)
Headache, body aches, chills this am. She had a negative home Covid test.

## 2021-04-23 NOTE — ED Provider Notes (Addendum)
[facility] EMERGENCY DEPARTMENT Provider Note   CSN: [id number] Arrival date & time: 04/23/21  1418     History Chief Complaint  Patient presents with  . URI    Haley Baird is a 30 y.o. female presents to the ED for evaluation of sudden onset pain in both legs, back, both arms, head, eyes since waking up this morning.  Patient is crying from severity of the pain.  Also had nausea, multiple episodes of nonbilious nonbloody emesis and abdominal cramping when she woke up this morning but this has completely resolved.  Has chills, cold sweats.  She took an over-the-counter rapid COVID test at home which was negative.  Denies fever, nasal congestion, sore throat, cough, chest pain, shortness of breath, diarrhea or constipation, dysuria, loss of taste or smell.  Not vaccinated for COVID.  No sick contacts.  No travel.  Has a daughter at home who is well and without symptoms.  No interventions prior to arrival. HPI     History reviewed. No pertinent past medical history.  There are no problems to display for this patient.   Past Surgical History:  Procedure Laterality Date  . APPENDECTOMY       OB History    Gravida  1   Para  1   Term      Preterm      AB      Living        SAB      IAB      Ectopic      Multiple      Live Births              No family history on file.  Social History   Tobacco Use  . Smoking status: Current Every Day Smoker    Packs/day: 0.50    Types: Cigarettes  . Smokeless tobacco: Never Used  Substance Use Topics  . Alcohol use: No  . Drug use: Never    Home Medications Prior to Admission medications   Medication Sig Start Date End Date Taking? Authorizing Provider  ondansetron (ZOFRAN ODT) 4 MG disintegrating tablet Take 1 tablet (4 mg total) by mouth every 8 (eight) hours as needed for nausea or vomiting. 04/23/21  Yes Liberty Handy, PA-C  acetaminophen (TYLENOL 8 HOUR) 650 MG CR tablet Take 1 tablet (650 mg  total) by mouth every 8 (eight) hours. 06/21/18   Derwood Kaplan, MD  cephALEXin (KEFLEX) 500 MG capsule Take 1 capsule (500 mg total) by mouth 4 (four) times daily. 02/03/18   Geoffery Lyons, MD  chlorpheniramine-HYDROcodone (TUSSIONEX PENNKINETIC ER) 10-8 MG/5ML SUER Take 5 mLs by mouth every 12 (twelve) hours as needed. 11/18/17   Molpus, John, MD  ibuprofen (ADVIL,MOTRIN) 600 MG tablet Take 1 tablet (600 mg total) by mouth every 6 (six) hours as needed. 06/21/18   Derwood Kaplan, MD  methocarbamol (ROBAXIN) 750 MG tablet Take 1-2 tablets (750-1,500 mg total) by mouth 3 (three) times daily as needed for muscle spasms. 03/12/18   Cristina Gong, PA-C  norethindrone-ethinyl estradiol-iron (ESTROSTEP FE,TILIA FE,TRI-LEGEST FE) 1-20/1-30/1-35 MG-MCG tablet Take 1 tablet by mouth daily.    [provider]  promethazine (PHENERGAN) 25 MG tablet Take 1 tablet (25 mg total) by mouth every 6 (six) hours as needed for nausea or vomiting. 11/18/17   Molpus, Jonny Ruiz, MD    Allergies    Patient has no known allergies.  Review of Systems   Review of Systems  Constitutional: Positive  for chills and diaphoresis.  Gastrointestinal: Positive for abdominal pain (Resolved), nausea (Resolved) and vomiting (Resolved).  Musculoskeletal: Positive for back pain and myalgias.  All other systems reviewed and are negative.   Physical Exam Updated Vital Signs BP 112/75   Pulse 98   Temp 99.2 F (37.3 C) (Oral)   Resp 20   Ht 5\' 3"  (1.6 m)   Wt 56.2 kg   SpO2 100%   BMI 21.95 kg/m   Physical Exam Vitals and nursing note reviewed.  Constitutional:      Appearance: She is well-developed.     Comments: Crying loudly during encounter patient states she is hurting "all over"  HENT:     Head: Normocephalic and atraumatic.     Nose: Nose normal.     Comments: Clear rhinorrhea-patient states this is from chronic    Mouth/Throat:     Comments: Moist mucous membranes.  Normal oropharynx and  tonsils Eyes:     Conjunctiva/sclera: Conjunctivae normal.  Cardiovascular:     Rate and Rhythm: Normal rate and regular rhythm.  Pulmonary:     Effort: Pulmonary effort is normal.     Breath sounds: Normal breath sounds.  Abdominal:     General: Bowel sounds are normal.     Palpations: Abdomen is soft.     Tenderness: There is no abdominal tenderness.     Comments: No G/R/R. No suprapubic or CVA tenderness. Negative Murphy's and McBurney's. Active BS to lower quadrants.   Musculoskeletal:        General: Normal range of motion.     Cervical back: Normal range of motion.     Comments: No reproducible CTL spine midline or paraspinal tenderness.  Patient moving upper and lower extremities spontaneously in bed without significant discomfort.  No focal edema, tenderness of the joints in the upper and lower extremities.  No calf edema or tenderness.  Skin:    General: Skin is warm and dry.     Capillary Refill: Capillary refill takes less than 2 seconds.  Neurological:     Mental Status: She is alert.  Psychiatric:        Behavior: Behavior normal.     ED Results / Procedures / Treatments   Labs (all labs ordered are listed, but only abnormal results are displayed) Labs Reviewed  RESP PANEL BY RT-PCR (FLU A&B, COVID) ARPGX2 - Abnormal; Notable for the following components:      Result Value   SARS Coronavirus 2 by RT PCR POSITIVE (*)    All other components within normal limits    EKG None  Radiology No results found.  Procedures Procedures   Medications Ordered in ED Medications  ketorolac (TORADOL) 30 MG/ML injection 30 mg (30 mg Intramuscular Given 04/23/21 1826)  acetaminophen (TYLENOL) tablet 1,000 mg (1,000 mg Oral Given 04/23/21 1826)    ED Course  I have reviewed the triage vital signs and the nursing notes.  Pertinent labs & imaging results that were available during my care of the patient were reviewed by me and considered in my medical decision making (see  chart for details).    MDM Rules/Calculators/A&P                          30 year old female presents to the ED for sudden onset severe myalgias, headache, nausea, vomiting and abdominal pain upon waking up this morning.  On vaccinated for COVID.  Denies respiratory symptoms.  Nausea vomiting and abdominal pain  have completely resolved.  Is crying due to the severity of the body pain.  Exam is overall reassuring.  Normal vital signs.  No abdominal tenderness.  No focal musculoskeletal tenderness.  Lungs clear.  Given reported severity of body pain, nausea, vomiting, abdominal pain offered patient blood work, urinalysis, IV fluids and Toradol.  She declined this stating that she had been here "all day".  Patient had been in the emergency department for 4 hours.  She declined blood work.  She was given Tylenol and IM Toradol.  COVID/flu was pending at discharge and at the time of writing my note it has returned positive for COVID infection.  We will attempt to notify the patient via phone. Zofran prescription.   1955: Attempted to call patient x 1, no pick up. She was advised at discharge to create Mychart account to check on results later tonight.  Final Clinical Impression(s) / ED Diagnoses Final diagnoses:  Myalgia  Nausea and vomiting in adult    Rx / DC Orders ED Discharge Orders         Ordered    ondansetron (ZOFRAN ODT) 4 MG disintegrating tablet  Every 8 hours PRN        04/23/21 1837           Liberty Handy, PA-C 04/23/21 1953    Liberty Handy, PA-C 04/23/21 1955    Virgina Norfolk, DO 04/23/21 2342

## 2021-04-23 NOTE — Discharge Instructions (Addendum)
You were seen in the emergency department for diffuse body pain, nausea, vomiting  You declined lab work and urinalysis.  You were tested for COVID and influenza, these results are pending.  These will be back by the end of the night.  You may create a MyChart account and check your results.  See instructions below.  Stay hydrated.  I prescribed Zofran for nausea as needed.  You were given an injection of an anti-inflammatory here and Tylenol.  Alternate ibuprofen and acetaminophen at home for body pain.  For pain and inflammation you can use a combination of ibuprofen and acetaminophen.  Take [phone] mg acetaminophen (tylenol) every 6 hours or 600 mg ibuprofen (advil, motrin) every 6 hours.  You can take these separately or combine them every 6 hours for maximum pain control. Do not exceed 4,000 mg acetaminophen or 2,400 mg ibuprofen in a 24 hour period.  Do not take ibuprofen containing products if you have history of kidney disease, ulcers, GI bleeding, severe acid reflux, or take a blood thinner.  Do not take acetaminophen if you have liver disease.   Return to the ED for worsening or new symptoms

## 2021-04-24 ENCOUNTER — Telehealth: Payer: Self-pay | Admitting: Nurse Practitioner

## 2021-04-24 NOTE — Telephone Encounter (Signed)
Called to discuss with patient about COVID-19 symptoms and the use of one of the available treatments for those with mild to moderate Covid symptoms and at a high risk of hospitalization.  Pt appears to qualify for outpatient treatment due to co-morbid conditions and/or a member of an at-risk group in accordance with the FDA Emergency Use Authorization.    Symptom onset: 04/23/2021 Vaccinated: not on file  Booster? Not on file Immunocompromised? No  Qualifiers: AA, smoker  NIH Criteria: 4  Unable to reach pt - Voicemail left.   Willette Alma, NP  Covid Infusion/Treatment team

## 2021-10-18 ENCOUNTER — Emergency Department (HOSPITAL_BASED_OUTPATIENT_CLINIC_OR_DEPARTMENT_OTHER)
Admission: EM | Admit: 2021-10-18 | Discharge: 2021-10-18 | Disposition: A | Discharge status: Home / Self Care | Payer: Managed Care, Other (non HMO) | Attending: Emergency Medicine | Admitting: Emergency Medicine

## 2021-10-18 ENCOUNTER — Other Ambulatory Visit: Payer: Self-pay

## 2021-10-18 ENCOUNTER — Encounter (HOSPITAL_BASED_OUTPATIENT_CLINIC_OR_DEPARTMENT_OTHER): Payer: Self-pay | Admitting: Emergency Medicine

## 2021-10-18 DIAGNOSIS — B3731 Acute candidiasis of vulva and vagina: Secondary | ICD-10-CM | POA: Diagnosis not present

## 2021-10-18 DIAGNOSIS — F17210 Nicotine dependence, cigarettes, uncomplicated: Secondary | ICD-10-CM | POA: Insufficient documentation

## 2021-10-18 DIAGNOSIS — N898 Other specified noninflammatory disorders of vagina: Secondary | ICD-10-CM | POA: Diagnosis present

## 2021-10-18 LAB — URINALYSIS, ROUTINE W REFLEX MICROSCOPIC
Bilirubin Urine: NEGATIVE
Glucose, UA: NEGATIVE mg/dL
Ketones, ur: NEGATIVE mg/dL
Leukocytes,Ua: NEGATIVE
Nitrite: NEGATIVE
Protein, ur: NEGATIVE mg/dL
Specific Gravity, Urine: >=1.03 (ref 1.005–1.030)
pH: 6 (ref 5.0–8.0)

## 2021-10-18 LAB — WET PREP, GENITAL
Clue Cells Wet Prep HPF POC: NONE SEEN
Sperm: NONE SEEN
Trich, Wet Prep: NONE SEEN
Yeast Wet Prep HPF POC: NONE SEEN

## 2021-10-18 LAB — URINALYSIS, MICROSCOPIC (REFLEX)

## 2021-10-18 LAB — PREGNANCY, URINE: Preg Test, Ur: NEGATIVE

## 2021-10-18 MED ORDER — FLUCONAZOLE 150 MG PO TABS
150.0000 mg | ORAL_TABLET | Freq: Once | ORAL | Status: AC
  Administered 2021-10-18: 150 mg via ORAL
  Filled 2021-10-18: qty 1

## 2021-10-18 MED ORDER — FLUCONAZOLE 150 MG PO TABS: ORAL_TABLET | ORAL | 0 refills | Status: AC

## 2021-10-18 NOTE — ED Triage Notes (Signed)
Pt reports seeing thick white vaginal discharge tonight.

## 2021-10-18 NOTE — ED Provider Notes (Signed)
[facility] Provider Note: Haley Dell, MD, FACEP  CSN: [id number] MRN: [id number] ARRIVAL: 10/18/21 at 0307 ROOM: MH11/[facility]11   CHIEF COMPLAINT  Vaginal Discharge   HISTORY OF PRESENT ILLNESS  10/18/21 3:17 AM Haley Baird is a 30 y.o. female who noticed a thick, vaginal discharge earlier this morning while using the bathroom at work.  She describes the discharge as thick and curd-like.  It does not have an abnormal odor.  She is having no associated discomfort.    History reviewed. No pertinent past medical history.  Past Surgical History:  Procedure Laterality Date   APPENDECTOMY      History reviewed. No pertinent family history.  Social History   Tobacco Use   Smoking status: Every Day    Packs/day: 0.50    Types: Cigarettes   Smokeless tobacco: Never  Substance Use Topics   Alcohol use: No   Drug use: Never    Prior to Admission medications   Medication Sig Start Date End Date Taking? Authorizing Provider  fluconazole (DIFLUCAN) 150 MG tablet Take on 10/21/2021 if symptoms of yeast infection persist. 10/18/21  Yes Haley Grandmaison, MD  norethindrone-ethinyl estradiol-iron (ESTROSTEP FE,TILIA FE,TRI-LEGEST FE) 1-20/1-30/1-35 MG-MCG tablet Take 1 tablet by mouth daily.    [provider]  ondansetron (ZOFRAN ODT) 4 MG disintegrating tablet Take 1 tablet (4 mg total) by mouth every 8 (eight) hours as needed for nausea or vomiting. 04/23/21   Liberty Handy, PA-C    Allergies Patient has no known allergies.   REVIEW OF SYSTEMS  Negative except as noted here or in the History of Present Illness.   PHYSICAL EXAMINATION  Initial Vital Signs Blood pressure 128/75, pulse 76, temperature 97.8 F (36.6 C), temperature source Oral, resp. rate 16, height 5' (1.524 m), weight 56.2 kg, SpO2 100 %.  Examination General: Well-developed, well-nourished female in no acute distress; appearance consistent with age of record HENT: normocephalic;  atraumatic Eyes: pupils equal, round and reactive to light; extraocular muscles intact Neck: supple Heart: regular rate and rhythm Lungs: clear to auscultation bilaterally Abdomen: soft; nondistended; nontender; bowel sounds present GU: Normal external genitalia; white curd-like vaginal discharge; no vaginal bleeding; no cervical motion tenderness; no adnexal tenderness Extremities: No deformity; full range of motion; pulses normal Neurologic: Awake, alert and oriented; motor function intact in all extremities and symmetric; no facial droop Skin: Warm and dry Psychiatric: Normal mood and affect   RESULTS  Summary of this visit's results, reviewed and interpreted by myself:   EKG Interpretation  Date/Time:    Ventricular Rate:    PR Interval:    QRS Duration:   QT Interval:    QTC Calculation:   R Axis:     Text Interpretation:         Laboratory Studies: Results for orders placed or performed during the hospital encounter of 10/18/21 (from the past 24 hour(s))  Urinalysis, Routine w reflex microscopic Urine, Clean Catch     Status: Abnormal   Collection Time: 10/18/21  3:11 AM  Result Value Ref Range   Color, Urine YELLOW YELLOW   APPearance CLEAR CLEAR   Specific Gravity, Urine >=1.030 1.005 - 1.030   pH 6.0 5.0 - 8.0   Glucose, UA NEGATIVE NEGATIVE mg/dL   Hgb urine dipstick MODERATE (A) NEGATIVE   Bilirubin Urine NEGATIVE NEGATIVE   Ketones, ur NEGATIVE NEGATIVE mg/dL   Protein, ur NEGATIVE NEGATIVE mg/dL   Nitrite NEGATIVE NEGATIVE   Leukocytes,Ua NEGATIVE NEGATIVE  Pregnancy, urine  Status: None   Collection Time: 10/18/21  3:11 AM  Result Value Ref Range   Preg Test, Ur NEGATIVE NEGATIVE  Urinalysis, Microscopic (reflex)     Status: Abnormal   Collection Time: 10/18/21  3:11 AM  Result Value Ref Range   RBC / HPF 11-20 0 - 5 RBC/hpf   WBC, UA 0-5 0 - 5 WBC/hpf   Bacteria, UA FEW (A) NONE SEEN   Squamous Epithelial / LPF 6-10 0 - 5   Mucus PRESENT    Wet prep, genital     Status: Abnormal   Collection Time: 10/18/21  3:25 AM   Specimen: Vaginal; Genital  Result Value Ref Range   Yeast Wet Prep HPF POC NONE SEEN NONE SEEN   Trich, Wet Prep NONE SEEN NONE SEEN   Clue Cells Wet Prep HPF POC NONE SEEN NONE SEEN   WBC, Wet Prep HPF POC FEW (A) NONE SEEN   Sperm NONE SEEN    Imaging Studies: No results found.  ED COURSE and MDM  Nursing notes, initial and subsequent vitals signs, including pulse oximetry, reviewed and interpreted by myself.  Vitals:   10/18/21 0314 10/18/21 0316  BP:  128/75  Pulse:  76  Resp:  16  Temp:  97.8 F (36.6 C)  TempSrc:  Oral  SpO2:  100%  Weight: 56.2 kg   Height: 5' (1.524 m)    Medications  fluconazole (DIFLUCAN) tablet 150 mg (has no administration in time range)   Examination consistent with candidal vulvovaginitis.  The wet prep is negative for yeast but it is not uncommon to have a false negative wet prep.  We will go ahead and treat with Diflucan.   PROCEDURES  Procedures   ED DIAGNOSES     ICD-10-CM   1. Vaginal yeast infection  B37.31          Haley Huron, MD 10/18/21 [phone]

## 2021-10-19 LAB — GC/CHLAMYDIA PROBE AMP (~~LOC~~) NOT AT ARMC
Chlamydia: NEGATIVE
Comment: NEGATIVE
Comment: NORMAL
Neisseria Gonorrhea: NEGATIVE

## 2022-04-24 ENCOUNTER — Other Ambulatory Visit (HOSPITAL_COMMUNITY)
Admission: RE | Admit: 2022-04-24 | Discharge: 2022-04-24 | Disposition: A | Discharge status: Home / Self Care | Payer: Managed Care, Other (non HMO) | Source: Ambulatory Visit | Attending: Nurse Practitioner | Admitting: Nurse Practitioner

## 2022-04-24 DIAGNOSIS — Z124 Encounter for screening for malignant neoplasm of cervix: Secondary | ICD-10-CM | POA: Diagnosis not present

## 2022-04-25 LAB — CYTOLOGY - PAP
Comment: NEGATIVE
Diagnosis: NEGATIVE
High risk HPV: NEGATIVE

## 2023-06-13 ENCOUNTER — Emergency Department (HOSPITAL_BASED_OUTPATIENT_CLINIC_OR_DEPARTMENT_OTHER): Payer: Managed Care, Other (non HMO)

## 2023-06-13 ENCOUNTER — Encounter (HOSPITAL_BASED_OUTPATIENT_CLINIC_OR_DEPARTMENT_OTHER): Payer: Self-pay

## 2023-06-13 ENCOUNTER — Emergency Department (HOSPITAL_BASED_OUTPATIENT_CLINIC_OR_DEPARTMENT_OTHER)
Admission: EM | Admit: 2023-06-13 | Discharge: 2023-06-13 | Disposition: A | Discharge status: Home / Self Care | Payer: Managed Care, Other (non HMO) | Attending: Emergency Medicine | Admitting: Emergency Medicine

## 2023-06-13 DIAGNOSIS — R109 Unspecified abdominal pain: Secondary | ICD-10-CM

## 2023-06-13 DIAGNOSIS — D649 Anemia, unspecified: Secondary | ICD-10-CM | POA: Diagnosis not present

## 2023-06-13 DIAGNOSIS — R102 Pelvic and perineal pain: Secondary | ICD-10-CM | POA: Insufficient documentation

## 2023-06-13 DIAGNOSIS — R1032 Left lower quadrant pain: Secondary | ICD-10-CM | POA: Diagnosis not present

## 2023-06-13 DIAGNOSIS — Z87891 Personal history of nicotine dependence: Secondary | ICD-10-CM | POA: Diagnosis not present

## 2023-06-13 LAB — URINALYSIS, MICROSCOPIC (REFLEX)

## 2023-06-13 LAB — COMPREHENSIVE METABOLIC PANEL
ALT: 13 U/L (ref 0–44)
AST: 15 U/L (ref 15–41)
Albumin: 3.6 g/dL (ref 3.5–5.0)
Alkaline Phosphatase: 43 U/L (ref 38–126)
Anion gap: 7 (ref 5–15)
BUN: 9 mg/dL (ref 6–20)
CO2: 25 mmol/L (ref 22–32)
Calcium: 8.7 mg/dL — ABNORMAL LOW (ref 8.9–10.3)
Chloride: 105 mmol/L (ref 98–111)
Creatinine, Ser: 0.71 mg/dL (ref 0.44–1.00)
GFR, Estimated: >60 mL/min (ref >60)
Glucose, Bld: 93 mg/dL (ref 70–99)
Potassium: 3.6 mmol/L (ref 3.5–5.1)
Sodium: 137 mmol/L (ref 135–145)
Total Bilirubin: 0.6 mg/dL (ref 0.3–1.2)
Total Protein: 6.4 g/dL — ABNORMAL LOW (ref 6.5–8.1)

## 2023-06-13 LAB — CBC WITH DIFFERENTIAL/PLATELET
Abs Immature Granulocytes: 0.02 10*3/uL (ref 0.00–0.07)
Basophils Absolute: 0 10*3/uL (ref 0.0–0.1)
Basophils Relative: 0 %
Eosinophils Absolute: 0.1 10*3/uL (ref 0.0–0.5)
Eosinophils Relative: 2 %
HCT: 35.9 % — ABNORMAL LOW (ref 36.0–46.0)
Hemoglobin: 11.7 g/dL — ABNORMAL LOW (ref 12.0–15.0)
Immature Granulocytes: 0 %
Lymphocytes Relative: 39 %
Lymphs Abs: 2.9 10*3/uL (ref 0.7–4.0)
MCH: 28.5 pg (ref 26.0–34.0)
MCHC: 32.6 g/dL (ref 30.0–36.0)
MCV: 87.6 fL (ref 80.0–100.0)
Monocytes Absolute: 0.8 10*3/uL (ref 0.1–1.0)
Monocytes Relative: 11 %
Neutro Abs: 3.5 10*3/uL (ref 1.7–7.7)
Neutrophils Relative %: 48 %
Platelets: 283 10*3/uL (ref 150–400)
RBC: 4.1 MIL/uL (ref 3.87–5.11)
RDW: 13.4 % (ref 11.5–15.5)
WBC: 7.4 10*3/uL (ref 4.0–10.5)
nRBC: 0 % (ref 0.0–0.2)

## 2023-06-13 LAB — URINALYSIS, ROUTINE W REFLEX MICROSCOPIC
Bilirubin Urine: NEGATIVE
Glucose, UA: NEGATIVE mg/dL
Ketones, ur: NEGATIVE mg/dL
Leukocytes,Ua: NEGATIVE
Nitrite: NEGATIVE
Protein, ur: NEGATIVE mg/dL
Specific Gravity, Urine: 1.02 (ref 1.005–1.030)
pH: 7.5 (ref 5.0–8.0)

## 2023-06-13 LAB — PREGNANCY, URINE: Preg Test, Ur: NEGATIVE

## 2023-06-13 LAB — LIPASE, BLOOD: Lipase: 27 U/L (ref 11–51)

## 2023-06-13 MED ORDER — DICYCLOMINE HCL 20 MG PO TABS: 20.0000 mg | ORAL_TABLET | Freq: Two times a day (BID) | ORAL | 0 refills | Status: AC

## 2023-06-13 MED ORDER — SODIUM CHLORIDE 0.9 % IV BOLUS: 1000.0000 mL | Freq: Once | INTRAVENOUS | Status: DC

## 2023-06-13 MED ORDER — KETOROLAC TROMETHAMINE 15 MG/ML IJ SOLN
15.0000 mg | Freq: Once | INTRAMUSCULAR | Status: DC
  Filled 2023-06-13: qty 1

## 2023-06-13 MED ORDER — PANTOPRAZOLE SODIUM 20 MG PO TBEC: 20.0000 mg | DELAYED_RELEASE_TABLET | Freq: Every day | ORAL | 0 refills | Status: AC

## 2023-06-13 NOTE — Discharge Instructions (Addendum)
  There are many causes of abdominal pain. Most pain is not serious and goes away, but some pain gets worse, changes, or will not go away. Please return to the emergency department or see your doctor right away if you (or your family member) experience any of the following:  1. Pain that gets worse or moves to just one spot.  2. Pain that gets worse if you cough or sneeze.  3. Pain with going over a bump in the road.  4. Pain that does not get better in 24 hours.  5. Inability to keep down liquids (vomiting)-especially if you are making less urine.  6. Fainting.  7. Blood in the vomit or stool.  8. High fever or shaking chills.  9. Swelling of the abdomen.  10. Any new or worsening problem.     Additional Instructions  No alcohol.  No caffeine, aspirin, or cigarettes.   Please return to the emergency department immediately for any new or concerning symptoms, or if you get worse.

## 2023-06-13 NOTE — ED Notes (Signed)
Lt green lab tubes hemolyzed, will redraw when pt returns from [place]

## 2023-06-13 NOTE — ED Notes (Signed)
Pt transported to [facility]

## 2023-06-13 NOTE — ED Notes (Signed)
Pt unable to provide urine sample at this time. IV attempted, unable to get line but blood work obtained.

## 2023-06-13 NOTE — ED Provider Notes (Signed)
[place] [facility] Provider Note  CSN: [id number] Arrival date & time: 06/13/23 4098  Chief Complaint(s) Abdominal Pain  HPI Haley Baird is a 32 y.o. female with past medical history as below, significant for AUB, IUD, appendectomy who presents to the ED with complaint of left lower quadrant abd, pelvic pain.  Symptoms ongoing over the past 2 days, worsening.  No nausea, vomiting, change in bowel or bladder function.  No abnormal vaginal bleeding.  She has IUD in place.  Does not believe that she is pregnant.  No concern for STI.  No vaginal bleeding or discharge routines are normal.  No fevers, chills, suspicious p.o. intake or trauma.  Denies similar symptoms in the past.  Reports she had food poisoning a few weeks ago but recovered well.  Tolerating p.o. intake without difficulty.  No medications prior to arrival  Past Medical History History reviewed. No pertinent past medical history. There are no problems to display for this patient.  Home Medication(s) Prior to Admission medications   Medication Sig Start Date End Date Taking? Authorizing Provider  dicyclomine (BENTYL) 20 MG tablet Take 1 tablet (20 mg total) by mouth 2 (two) times daily for 7 days. 06/13/23 06/20/23 Yes Tanda Rockers A, DO  pantoprazole (PROTONIX) 20 MG tablet Take 1 tablet (20 mg total) by mouth daily for 14 days. 06/13/23 06/27/23 Yes Sloan Leiter, DO  fluconazole (DIFLUCAN) 150 MG tablet Take on 10/21/2021 if symptoms of yeast infection persist. 10/18/21   Molpus, John, MD  norethindrone-ethinyl estradiol-iron (ESTROSTEP FE,TILIA FE,TRI-LEGEST FE) 1-20/1-30/1-35 MG-MCG tablet Take 1 tablet by mouth daily.    [provider]  ondansetron (ZOFRAN ODT) 4 MG disintegrating tablet Take 1 tablet (4 mg total) by mouth every 8 (eight) hours as needed for nausea or vomiting. 04/23/21   Liberty Handy, PA-C                                                                                                                                     Past Surgical History Past Surgical History:  Procedure Laterality Date   APPENDECTOMY     Family History History reviewed. No pertinent family history.  Social History Social History   Tobacco Use   Smoking status: Former    Packs/day: .5    Types: Cigarettes   Smokeless tobacco: Never  Substance Use Topics   Alcohol use: No   Drug use: Never   Allergies Patient has no known allergies.  Review of Systems Review of Systems  Constitutional:  Negative for activity change and fever.  HENT:  Negative for facial swelling and trouble swallowing.   Eyes:  Negative for discharge and redness.  Respiratory:  Negative for cough and shortness of breath.   Cardiovascular:  Negative for chest pain and palpitations.  Gastrointestinal:  Positive for abdominal pain. Negative for nausea.  Genitourinary:  Positive for pelvic pain. Negative for  dysuria and flank pain.  Musculoskeletal:  Negative for back pain and gait problem.  Skin:  Negative for pallor and rash.  Neurological:  Negative for syncope and headaches.    Physical Exam Vital Signs  I have reviewed the triage vital signs BP (!) 102/57   Pulse 66   Temp 98.1 F (36.7 C) (Oral)   Resp 16   Ht 5' (1.524 m)   Wt 66.7 kg   SpO2 100%   BMI 28.71 kg/m  Physical Exam Vitals and nursing note reviewed.  Constitutional:      General: She is not in acute distress.    Appearance: Normal appearance. She is well-developed. She is not ill-appearing.  HENT:     Head: Normocephalic and atraumatic.     Right Ear: External ear normal.     Left Ear: External ear normal.     Nose: Nose normal.     Mouth/Throat:     Mouth: Mucous membranes are moist.  Eyes:     General: No scleral icterus.       Right eye: No discharge.        Left eye: No discharge.  Cardiovascular:     Rate and Rhythm: Normal rate and regular rhythm.     Pulses: Normal pulses.     Heart sounds: Normal  heart sounds.  Pulmonary:     Effort: Pulmonary effort is normal. No respiratory distress.     Breath sounds: Normal breath sounds.  Abdominal:     General: Abdomen is flat.     Tenderness: There is abdominal tenderness. There is no guarding or rebound.    Musculoskeletal:        General: Normal range of motion.     Right lower leg: No edema.     Left lower leg: No edema.  Skin:    General: Skin is warm and dry.     Capillary Refill: Capillary refill takes less than 2 seconds.  Neurological:     Mental Status: She is alert.  Psychiatric:        Mood and Affect: Mood normal.        Behavior: Behavior normal.     ED Results and Treatments Labs (all labs ordered are listed, but only abnormal results are displayed) Labs Reviewed  URINALYSIS, ROUTINE W REFLEX MICROSCOPIC - Abnormal; Notable for the following components:      Result Value   Hgb urine dipstick TRACE (*)    All other components within normal limits  CBC WITH DIFFERENTIAL/PLATELET - Abnormal; Notable for the following components:   Hemoglobin 11.7 (*)    HCT 35.9 (*)    All other components within normal limits  COMPREHENSIVE METABOLIC PANEL - Abnormal; Notable for the following components:   Calcium 8.7 (*)    Total Protein 6.4 (*)    All other components within normal limits  URINALYSIS, MICROSCOPIC (REFLEX) - Abnormal; Notable for the following components:   Bacteria, UA RARE (*)    All other components within normal limits  PREGNANCY, URINE  LIPASE, BLOOD  Radiology US PELVIC COMPLETE W TRANSVAGINAL AND TORSION R/O  Result Date: 06/13/2023 CLINICAL DATA:  Left lower quadrant pain for 3 days.  IUD in place. EXAM: TRANSABDOMINAL AND TRANSVAGINAL ULTRASOUND OF PELVIS DOPPLER ULTRASOUND OF OVARIES TECHNIQUE: Both transabdominal and transvaginal ultrasound examinations of the pelvis were  performed. Transabdominal technique was performed for global imaging of the pelvis including uterus, ovaries, adnexal regions, and pelvic cul-de-sac. It was necessary to proceed with endovaginal exam following the transabdominal exam to visualize the endometrium and ovaries. Color and duplex Doppler ultrasound was utilized to evaluate blood flow to the ovaries. COMPARISON:  None Available. FINDINGS: Uterus Measurements: 9.0 x 5.2 x 5.5 cm = volume: 135 mL. No fibroids or other mass visualized. Endometrium Thickness: 8 mm.  IUD appears satisfactorily positioned. Right ovary Measurements: 2.8 x 1.2 x 1.4 cm = volume: 2.6 mL. Normal appearance/no adnexal mass. Left ovary Measurements: 4.3 x 1.5 x 2.1 cm = volume: 7.2 mL. Normal appearance/no adnexal mass. Pulsed Doppler evaluation of both ovaries demonstrates normal low-resistance arterial and venous waveforms. Other findings Trace pelvic free fluid. IMPRESSION: Largely unremarkable pelvic ultrasound with no cause of left lower quadrant pain identified. IUD in place. No evidence of ovarian torsion. Electronically Signed   By: Sebastian Ache M.D.   On: 06/13/2023 09:27    Pertinent labs & imaging results that were available during my care of the patient were reviewed by me and considered in my medical decision making (see MDM for details).  Medications Ordered in ED Medications - No data to display                                                                                                                                   Procedures Procedures  (including critical care time)  Medical Decision Making / ED Course    Medical Decision Making:    Leondra Cullin is a 32 y.o. female with past medical history as below, significant for AUB, IUD, appendectomy who presents to the ED with complaint of left lower quadrant abd, pelvic pain.. The complaint involves an extensive differential diagnosis and also carries with it a high risk of complications and  morbidity.  Serious etiology was considered. Ddx includes but is not limited to: Differential diagnosis includes but is not exclusive to ectopic pregnancy, ovarian cyst, ovarian torsion, acute appendicitis, urinary tract infection, endometriosis, bowel obstruction, hernia, colitis, renal colic, gastroenteritis, volvulus etc.   Complete initial physical exam performed, notably the patient  was NAD, abd not peritoneal, afebrile.    Reviewed and confirmed nursing documentation for past medical history, family history, social history.  Vital signs reviewed.    Clinical Course as of 06/13/23 1150  Fri Jun 13, 2023  0454 [place] "IMPRESSION: Largely unremarkable pelvic ultrasound with no cause of left lower quadrant pain identified. IUD in place. No evidence of ovarian torsion.  " [SG]  1021 Feeling better, does not want IVF or pain  medicine. She is able to tolerate PO w/o complaint [SG]  1149 Hemoglobin(!): 11.7 Similar to baseline [SG]    Clinical Course User Index [SG] Tanda Rockers A, DO   Ultrasound reviewed, imaging stable.  Labs reviewed and are stable.  Symptoms have resolved.  She is tolerated p.o. intake without difficulty.  The patient's overall condition has improved, the patient presents with abdominal pain without signs of peritonitis, or other life-threatening serious etiology. [The patient understands that at this time there is no evidence for a more malignant underlying process, but the patient also understands that early in the process of an illness, an emergency department workup can be falsely reassuring.] Detailed discussions were had with the patient regarding current findings, and need for close f/u with PCP or on call doctor. The patient appears stable for discharge and has been instructed to return immediately if the symptoms worsen in any way for re-evaluation. Patient verbalized understanding and is in agreement with current care plan.  All questions answered prior to  discharge.      Additional history obtained: -Additional history obtained from na -External records from outside source obtained and reviewed including: Chart review including previous notes, labs, imaging, consultation notes including prior ED visits, prior labs and imaging, home medications   Lab Tests: -I ordered, reviewed, and interpreted labs.   The pertinent results include:   Labs Reviewed  URINALYSIS, ROUTINE W REFLEX MICROSCOPIC - Abnormal; Notable for the following components:      Result Value   Hgb urine dipstick TRACE (*)    All other components within normal limits  CBC WITH DIFFERENTIAL/PLATELET - Abnormal; Notable for the following components:   Hemoglobin 11.7 (*)    HCT 35.9 (*)    All other components within normal limits  COMPREHENSIVE METABOLIC PANEL - Abnormal; Notable for the following components:   Calcium 8.7 (*)    Total Protein 6.4 (*)    All other components within normal limits  URINALYSIS, MICROSCOPIC (REFLEX) - Abnormal; Notable for the following components:   Bacteria, UA RARE (*)    All other components within normal limits  PREGNANCY, URINE  LIPASE, BLOOD    Notable for labs stable  EKG   EKG Interpretation  Date/Time:    Ventricular Rate:    PR Interval:    QRS Duration:   QT Interval:    QTC Calculation:   R Axis:     Text Interpretation:           Imaging Studies ordered: I ordered imaging studies including pelvic US I independently visualized the following imaging with scope of interpretation limited to determining acute life threatening conditions related to emergency care; findings noted above, significant for stable u/s I independently visualized and interpreted imaging. I agree with the radiologist interpretation   Medicines ordered and prescription drug management: Meds ordered this encounter  Medications   DISCONTD: ketorolac (TORADOL) 15 MG/ML injection 15 mg   DISCONTD: sodium chloride 0.9 % bolus 1,000 mL    dicyclomine (BENTYL) 20 MG tablet    Sig: Take 1 tablet (20 mg total) by mouth 2 (two) times daily for 7 days.    Dispense:  14 tablet    Refill:  0   pantoprazole (PROTONIX) 20 MG tablet    Sig: Take 1 tablet (20 mg total) by mouth daily for 14 days.    Dispense:  14 tablet    Refill:  0    -I have reviewed the patients home medicines and have made  adjustments as needed   Consultations Obtained: na   Cardiac Monitoring: The patient was maintained on a cardiac monitor.  I personally viewed and interpreted the cardiac monitored which showed an underlying rhythm of: SR  Social Determinants of Health:  Diagnosis or treatment significantly limited by social determinants of health: no pcp, fmr smoker   Reevaluation: After the interventions noted above, I reevaluated the patient and found that they have improved  Co morbidities that complicate the patient evaluation History reviewed. No pertinent past medical history.    Dispostion: Disposition decision including need for hospitalization was considered, and patient discharged from emergency department.    Final Clinical Impression(s) / ED Diagnoses Final diagnoses:  Abdominal pain, unspecified abdominal location     This chart was dictated using voice recognition software.  Despite best efforts to proofread,  errors can occur which can change the documentation meaning.    Tanda Rockers A, DO 06/13/23 1150

## 2023-06-13 NOTE — ED Triage Notes (Signed)
C/o left lower abdominal pain since last night. Denies other symptoms.

## 2023-10-02 ENCOUNTER — Other Ambulatory Visit: Payer: Self-pay

## 2023-10-02 ENCOUNTER — Encounter (HOSPITAL_BASED_OUTPATIENT_CLINIC_OR_DEPARTMENT_OTHER): Payer: Self-pay | Admitting: Emergency Medicine

## 2023-10-02 ENCOUNTER — Emergency Department (HOSPITAL_BASED_OUTPATIENT_CLINIC_OR_DEPARTMENT_OTHER): Payer: Managed Care, Other (non HMO)

## 2023-10-02 ENCOUNTER — Emergency Department (HOSPITAL_BASED_OUTPATIENT_CLINIC_OR_DEPARTMENT_OTHER)
Admission: EM | Admit: 2023-10-02 | Discharge: 2023-10-02 | Disposition: A | Discharge status: Home / Self Care | Payer: Managed Care, Other (non HMO) | Attending: Emergency Medicine | Admitting: Emergency Medicine

## 2023-10-02 DIAGNOSIS — M79645 Pain in left finger(s): Secondary | ICD-10-CM | POA: Insufficient documentation

## 2023-10-02 DIAGNOSIS — X503XXA Overexertion from repetitive movements, initial encounter: Secondary | ICD-10-CM | POA: Diagnosis not present

## 2023-10-02 MED ORDER — IBUPROFEN 800 MG PO TABS
800.0000 mg | ORAL_TABLET | Freq: Once | ORAL | Status: AC
  Administered 2023-10-02: 800 mg via ORAL
  Filled 2023-10-02: qty 1

## 2023-10-02 NOTE — ED Provider Notes (Signed)
[place] [facility] Provider Note   CSN: [id number] Arrival date & time: 10/02/23  4098     History Chief Complaint  Patient presents with   Hand Pain    Haley Baird is a 32 y.o. female patient who presents to the emergency department today for further evaluation of third left finger pain.  It is primarily localized on the palmar aspect near the left third MCP.  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  She is right-handed.  She does not recall any trauma or injury to the finger.  It is worse with bending of the finger.  She denies any fever, chills, drug use.   Hand Pain       Home Medications Prior to Admission medications   Medication Sig Start Date End Date Taking? Authorizing Provider  dicyclomine (BENTYL) 20 MG tablet Take 1 tablet (20 mg total) by mouth 2 (two) times daily for 7 days. 06/13/23 06/20/23  Sloan Leiter, DO  fluconazole (DIFLUCAN) 150 MG tablet Take on 10/21/2021 if symptoms of yeast infection persist. 10/18/21   Molpus, John, MD  norethindrone-ethinyl estradiol-iron (ESTROSTEP FE,TILIA FE,TRI-LEGEST FE) 1-20/1-30/1-35 MG-MCG tablet Take 1 tablet by mouth daily.    [provider]  ondansetron (ZOFRAN ODT) 4 MG disintegrating tablet Take 1 tablet (4 mg total) by mouth every 8 (eight) hours as needed for nausea or vomiting. 04/23/21   Liberty Handy, PA-C  pantoprazole (PROTONIX) 20 MG tablet Take 1 tablet (20 mg total) by mouth daily for 14 days. 06/13/23 06/27/23  Sloan Leiter, DO      Allergies    Patient has no known allergies.    Review of Systems   Review of Systems  All other systems reviewed and are negative.   Physical Exam Updated Vital Signs BP 105/61   Pulse 66   Temp 98.3 F (36.8 C) (Oral)   Ht 5\' 3"  (1.6 m)   Wt 68 kg   SpO2 100%   BMI 26.57 kg/m  Physical Exam Vitals and nursing note reviewed.  Constitutional:      Appearance: Normal  appearance.  HENT:     Head: Normocephalic and atraumatic.  Eyes:     General:        Right eye: No discharge.        Left eye: No discharge.     Conjunctiva/sclera: Conjunctivae normal.  Pulmonary:     Effort: Pulmonary effort is normal.  Musculoskeletal:     Comments: Patient able to make a fist with the left hand although it is painful.  Good strength with resistance.  Strong radial pulse felt on the left hand.  Good sensation.  There is point tenderness along the left third MCP on the palmar aspect.  There is no dorsal tenderness.  Finger does not catch with range of motion.  Skin:    General: Skin is warm and dry.     Findings: No rash.  Neurological:     General: No focal deficit present.     Mental Status: She is alert.  Psychiatric:        Mood and Affect: Mood normal.        Behavior: Behavior normal.     ED Results / Procedures / Treatments   Labs (all labs ordered are listed, but only abnormal results are displayed) Labs Reviewed - No data to display  EKG None  Radiology DG  Finger Middle Left  Result Date: 10/02/2023 CLINICAL DATA:  Pain involving the proximal aspect of the middle finger of the left hand for the past day. No known injury. EXAM: LEFT MIDDLE FINGER 2+V COMPARISON:  None Available. FINDINGS: No fracture or dislocation. Joint spaces are preserved. No erosions. Regional soft tissues appear normal. IMPRESSION: No explanation for patient's middle finger pain. Specifically, no evidence of fracture or dislocation. Electronically Signed   By: Simonne Come M.D.   On: 10/02/2023 10:06    Procedures Procedures    Medications Ordered in ED Medications  ibuprofen (ADVIL) tablet 800 mg (has no administration in time range)    ED Course/ Medical Decision Making/ A&P Clinical Course as of 10/02/23 1141  Thu Oct 02, 2023  1133 DG Finger Middle Left I personally ordered and interpreted the study and I do not see any evidence of fracture or dislocation.  I do  agree with the radiologist interpretation. [CF]    Clinical Course User Index [CF] Haley Lower, PA-C   {   Click here for ABCD2, HEART and other calculators  Medical Decision Making [name] is a 32 y.o. female patient who presents to the emergency department today for further evaluation of left third finger pain at the left MCP.  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  This could be a strain.  The area does not appear to be infected.  I have a low suspicion for tenosynovitis or abscess at this time.  Will treat conservatively with NSAIDs and a static finger splint as it does appear to hurt worse when the finger is in flexion.  Patient agreeable with this plan.  Strict return precautions were discussed.  X-ray is normal.  She is safe for discharge.   Amount and/or Complexity of Data Reviewed Radiology: ordered. Decision-making details documented in ED Course.    Final Clinical Impression(s) / ED Diagnoses Final diagnoses:  Pain of finger of left hand    Rx / DC Orders ED Discharge Orders     None         Haley Baird, [place] 10/02/23 1141    Gwyneth Sprout, MD 10/02/23 1507

## 2023-10-02 NOTE — ED Triage Notes (Signed)
Pt to ER with c/o left hand middle finger pain that started when she woke up on Tuesday AM.  Pt states pain continued throughout the day and has worsened this AM.  Pt denies known injury.

## 2023-10-02 NOTE — Discharge Instructions (Signed)
As we discussed, I would like for you to start taking 600 mg of ibuprofen every 6 hours starting tomorrow.  You can add on Tylenol at the 3 to 4-hour mark if you are still experiencing pain.  I would do this for approximately 4 days and then take a break.  Please take the ibuprofen with some food as this can upset your stomach sometimes.  You may return to the emergency department for any worsening symptoms.

## 2024-02-11 ENCOUNTER — Other Ambulatory Visit: Payer: Self-pay

## 2024-02-11 ENCOUNTER — Encounter (HOSPITAL_BASED_OUTPATIENT_CLINIC_OR_DEPARTMENT_OTHER): Payer: Self-pay

## 2024-02-11 ENCOUNTER — Emergency Department (HOSPITAL_BASED_OUTPATIENT_CLINIC_OR_DEPARTMENT_OTHER): Payer: Managed Care, Other (non HMO)

## 2024-02-11 ENCOUNTER — Emergency Department (HOSPITAL_BASED_OUTPATIENT_CLINIC_OR_DEPARTMENT_OTHER)
Admission: EM | Admit: 2024-02-11 | Discharge: 2024-02-11 | Disposition: A | Discharge status: Home / Self Care | Payer: Managed Care, Other (non HMO)

## 2024-02-11 DIAGNOSIS — S060X0A Concussion without loss of consciousness, initial encounter: Secondary | ICD-10-CM | POA: Insufficient documentation

## 2024-02-11 DIAGNOSIS — S0990XA Unspecified injury of head, initial encounter: Secondary | ICD-10-CM | POA: Diagnosis present

## 2024-02-11 DIAGNOSIS — Y92410 Unspecified street and highway as the place of occurrence of the external cause: Secondary | ICD-10-CM | POA: Insufficient documentation

## 2024-02-11 DIAGNOSIS — M25521 Pain in right elbow: Secondary | ICD-10-CM | POA: Diagnosis not present

## 2024-02-11 MED ORDER — ACETAMINOPHEN 500 MG PO TABS
1000.0000 mg | ORAL_TABLET | Freq: Once | ORAL | Status: AC
  Administered 2024-02-11: 1000 mg via ORAL
  Filled 2024-02-11: qty 2

## 2024-02-11 NOTE — Discharge Instructions (Addendum)
 You may take over-the-counter Tylenol alternating with ibuprofen for pain.  Return immediately felt fevers, chills, severe headache, vision changes, facial droop, seizure, passout, chest pain, shortness of breath, abdominal pain, nausea vomiting or any new or worsening symptoms that are concerning to you.
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
## 2024-08-09 ENCOUNTER — Encounter (HOSPITAL_BASED_OUTPATIENT_CLINIC_OR_DEPARTMENT_OTHER): Payer: Self-pay | Admitting: Emergency Medicine

## 2024-08-09 ENCOUNTER — Other Ambulatory Visit: Payer: Self-pay

## 2024-08-09 ENCOUNTER — Emergency Department (HOSPITAL_BASED_OUTPATIENT_CLINIC_OR_DEPARTMENT_OTHER)
Admission: EM | Admit: 2024-08-09 | Discharge: 2024-08-09 | Disposition: A | Discharge status: Home / Self Care | Attending: Emergency Medicine | Admitting: Emergency Medicine

## 2024-08-09 DIAGNOSIS — B9689 Other specified bacterial agents as the cause of diseases classified elsewhere: Secondary | ICD-10-CM | POA: Insufficient documentation

## 2024-08-09 DIAGNOSIS — N898 Other specified noninflammatory disorders of vagina: Secondary | ICD-10-CM

## 2024-08-09 DIAGNOSIS — N760 Acute vaginitis: Secondary | ICD-10-CM | POA: Diagnosis not present

## 2024-08-09 DIAGNOSIS — Z87891 Personal history of nicotine dependence: Secondary | ICD-10-CM | POA: Diagnosis not present

## 2024-08-09 DIAGNOSIS — A5901 Trichomonal vulvovaginitis: Secondary | ICD-10-CM | POA: Diagnosis not present

## 2024-08-09 LAB — URINALYSIS, ROUTINE W REFLEX MICROSCOPIC
Bilirubin Urine: NEGATIVE
Glucose, UA: NEGATIVE mg/dL
Ketones, ur: NEGATIVE mg/dL
Leukocytes,Ua: NEGATIVE
Nitrite: NEGATIVE
Protein, ur: NEGATIVE mg/dL
Specific Gravity, Urine: 1.02 (ref 1.005–1.030)
pH: 8.5 — ABNORMAL HIGH (ref 5.0–8.0)

## 2024-08-09 LAB — URINALYSIS, MICROSCOPIC (REFLEX)

## 2024-08-09 LAB — WET PREP, GENITAL
Sperm: NONE SEEN
WBC, Wet Prep HPF POC: <10 (ref <10)
Yeast Wet Prep HPF POC: NONE SEEN

## 2024-08-09 LAB — PREGNANCY, URINE: Preg Test, Ur: NEGATIVE

## 2024-08-09 MED ORDER — DOXYCYCLINE HYCLATE 100 MG PO TABS
100.0000 mg | ORAL_TABLET | Freq: Once | ORAL | Status: AC
  Administered 2024-08-09: 100 mg via ORAL
  Filled 2024-08-09: qty 1

## 2024-08-09 MED ORDER — METRONIDAZOLE 500 MG PO TABS: 500.0000 mg | ORAL_TABLET | Freq: Two times a day (BID) | ORAL | 0 refills | Status: AC

## 2024-08-09 MED ORDER — DOXYCYCLINE HYCLATE 100 MG PO CAPS: 100.0000 mg | ORAL_CAPSULE | Freq: Two times a day (BID) | ORAL | 0 refills | Status: AC

## 2024-08-09 MED ORDER — METRONIDAZOLE 500 MG PO TABS
500.0000 mg | ORAL_TABLET | Freq: Once | ORAL | Status: AC
  Administered 2024-08-09: 500 mg via ORAL
  Filled 2024-08-09: qty 1

## 2024-08-09 MED ORDER — CEFTRIAXONE SODIUM 500 MG IJ SOLR
500.0000 mg | Freq: Once | INTRAMUSCULAR | Status: AC
  Administered 2024-08-09: 500 mg via INTRAMUSCULAR
  Filled 2024-08-09: qty 500

## 2024-08-09 NOTE — ED Provider Notes (Signed)
 [place] [facility] DE[facility] AT [facility] [place] Provider Note   CSN: [id number] Arrival date & time: 08/09/24  1757     Patient presents with: Vaginal Discharge (/)   Haley Baird is a 33 y.o. female with no pertinent PMHx presents to ED for evaluation of thick white discharge she noticed today. Described DC as cottage cheese appearing. No urinary symptoms, pain with intercourse, fevers, vaginal odor. LMP last week and normal.   No known STD exposure but is interested in STD testing     Vaginal Discharge      Prior to Admission medications   Medication Sig Start Date End Date Taking? Authorizing Provider  doxycycline  (VIBRAMYCIN ) 100 MG capsule Take 1 capsule (100 mg total) by mouth 2 (two) times daily for 7 days. 08/09/24 08/16/24 Yes Minnie Tinnie BRAVO, PA  metroNIDAZOLE  (FLAGYL ) 500 MG tablet Take 1 tablet (500 mg total) by mouth 2 (two) times daily. 08/09/24  Yes Minnie Tinnie BRAVO, PA  dicyclomine  (BENTYL ) 20 MG tablet Take 1 tablet (20 mg total) by mouth 2 (two) times daily for 7 days. 06/13/23 06/20/23  Elnor Jayson LABOR, DO  fluconazole  (DIFLUCAN ) 150 MG tablet Take on 10/21/2021 if symptoms of yeast infection persist. 10/18/21   Molpus, Norleen, MD  norethindrone-ethinyl estradiol-iron (ESTROSTEP FE,TILIA FE,TRI-LEGEST FE) 1-20/1-30/1-35 MG-MCG tablet Take 1 tablet by mouth daily.    [provider]  ondansetron  (ZOFRAN  ODT) 4 MG disintegrating tablet Take 1 tablet (4 mg total) by mouth every 8 (eight) hours as needed for nausea or vomiting. 04/23/21   Norris Will PARAS, PA-C  pantoprazole  (PROTONIX ) 20 MG tablet Take 1 tablet (20 mg total) by mouth daily for 14 days. 06/13/23 06/27/23  Elnor Jayson LABOR, DO    Allergies: Patient has no known allergies.    Review of Systems  Genitourinary:  Positive for vaginal discharge.    Updated Vital Signs BP 115/66 (BP Location: Right Arm)   Pulse 71   Temp 98.3 F (36.8 C) (Oral)   Resp 18   Ht 5' 1 (1.549 m)   Wt 66.7 kg    SpO2 100%   BMI 27.78 kg/m   Physical Exam Vitals and nursing note reviewed. Exam conducted with a chaperone present.  Constitutional:      General: She is not in acute distress.    Appearance: Normal appearance.  HENT:     Head: Normocephalic and atraumatic.  Eyes:     Conjunctiva/sclera: Conjunctivae normal.  Cardiovascular:     Rate and Rhythm: Normal rate.  Pulmonary:     Effort: Pulmonary effort is normal. No respiratory distress.     Breath sounds: Normal breath sounds.  Abdominal:     General: Bowel sounds are normal. There is no distension.     Palpations: Abdomen is soft.     Tenderness: There is no abdominal tenderness. There is no right CVA tenderness, left CVA tenderness, guarding or rebound.     Comments: No abdominal tenderness with light or deep palpation.  No peritoneal signs.  Genitourinary:    General: Normal vulva.     Exam position: Knee-chest position.     Vagina: No foreign body. Vaginal discharge present. No erythema, tenderness, bleeding, lesions or prolapsed vaginal walls.     Cervix: Normal. No cervical motion tenderness, friability, erythema or cervical bleeding.     Comments: White-grey discharge in vaginal vault. Cervix closed with no erythema, lesions, nor friability. No CMT nor adnexal tenderness Skin:    Coloration: Skin is not  jaundiced or pale.  Neurological:     Mental Status: She is alert. Mental status is at baseline.   GU exam performed with Curtistine Berber RN  (all labs ordered are listed, but only abnormal results are displayed) Labs Reviewed  WET PREP, GENITAL - Abnormal; Notable for the following components:      Result Value   Trich, Wet Prep PRESENT (*)    Clue Cells Wet Prep HPF POC PRESENT (*)    All other components within normal limits  URINALYSIS, ROUTINE W REFLEX MICROSCOPIC - Abnormal; Notable for the following components:   APPearance CLOUDY (*)    pH 8.5 (*)    Hgb urine dipstick TRACE (*)    All other components  within normal limits  URINALYSIS, MICROSCOPIC (REFLEX) - Abnormal; Notable for the following components:   Bacteria, UA FEW (*)    All other components within normal limits  PREGNANCY, URINE  GC/CHLAMYDIA PROBE AMP ([place]) NOT AT [facility]    EKG: None  Radiology: No results found.  Medications Ordered in the ED  metroNIDAZOLE  (FLAGYL ) tablet 500 mg (500 mg Oral Given 08/09/24 2004)  cefTRIAXone  (ROCEPHIN ) injection 500 mg (500 mg Intramuscular Given 08/09/24 2004)  doxycycline  (VIBRA -TABS) tablet 100 mg (100 mg Oral Given 08/09/24 2004)                                    Medical Decision Making Amount and/or Complexity of Data Reviewed Labs: ordered.  Risk Prescription drug management.   Patient presents to the ED for concern of vaginal discharge, this involves an extensive number of treatment options, and is a complaint that carries with it a high risk of complications and morbidity.  The differential diagnosis includes gonorrhea, chlamydia, BV, trichomonas, foreign body, normal vaginal discharge, PID, pregnancy   Co morbidities that complicate the patient evaluation  None   Additional history obtained:  Additional history obtained from Nursing   External records from outside source obtained and reviewed including triage note   Lab Tests:  I Ordered, and personally interpreted labs.  The pertinent results include:   Wet prep positive for trichomoniasis, clue cells UA without signs of infection hCG negative    Medicines ordered and prescription drug management:  I ordered medication including Doxy, Rocephin , Flagyl  for STD prophylaxis, BV, trichomoniasis Reevaluation of the patient after these medicines showed that the patient stayed the same I have reviewed the patients home medicines and have made adjustments as needed    Problem List / ED Course:  Vaginal discharge BV Trichomonas Provided Flagyl  for BV, trichomonas Low suspicion for PID with no  CMT.  No abdominal, uterine, nor adnexal tenderness tenderness.  Vital signs WNL with no fever nor tachycardia.  No WBC on wet mount Shared decision making is had with patient regarding treating for STDs empirically as she tested positive for trichomonas.    I discussed that it would take 24-36 hours to receive results and she can review these on MyChart as well as we would call her with positive results if she required treatment. She wishes to be treated for STDs prior to obtaining results. I discussed that she should refrain from sexual intercourse as she may spread trichomonas.  I also discussed that she needs to have her partner tested and likely treated.  She expressed understanding agrees Will have patient follow-up with health department as needed for GYN concerns I discussed strict return  precautions to include signs of PID, including worsening symptoms, fever, pain with intercourse, severe debilitating abdominal pain.  She expressed understanding and agrees   Reevaluation:  After the interventions noted above, I reevaluated the patient and found that they have :stayed the same   Social Determinants of Health:  Former tobacco use   Dispostion:  After consideration of the diagnostic results and the patients response to treatment, I feel that the patent would benefit from outpatient management.   Discussed ED workup, disposition, return to ED precautions with patient who expresses understanding agrees with plan.  All questions answered to their satisfaction.  They are agreeable to plan.  Discharge instructions provided on paperwork  Final diagnoses:  BV (bacterial vaginosis)  Trichomonas vaginitis  Vaginal discharge    ED Discharge Orders          Ordered    metroNIDAZOLE  (FLAGYL ) 500 MG tablet  2 times daily        08/09/24 1948    doxycycline  (VIBRAMYCIN ) 100 MG capsule  2 times daily        08/09/24 1948             Minnie Tinnie BRAVO, PA 08/09/24 2135     Ruthe Cornet, DO 08/09/24 2154

## 2024-08-09 NOTE — ED Triage Notes (Signed)
 Pt presents with pelvic pain and thick, white vaginal discharge starting yesterday

## 2024-08-09 NOTE — Discharge Instructions (Signed)
 Thank for letting us  evaluate you today.  Will we have diagnosed you with BV which is a bacterial overgrowth and trichomonas which is an STD.  We have also treated you for gonorrhea, chlamydia.  You do not have a UTI nor are you pregnant.  I have sent 2 prescriptions to your pharmacy.  Please take as prescribed and do not miss a dose even if you are feeling better.  Your results will result within the next 24 hours and you can see these on MyChart.  Return to Emergency Department for experience significant pain, worsening symptoms

## 2024-08-10 LAB — GC/CHLAMYDIA PROBE AMP (~~LOC~~) NOT AT ARMC
Chlamydia: NEGATIVE
Comment: NEGATIVE
Comment: NORMAL
Neisseria Gonorrhea: NEGATIVE
